# Patient Record
Sex: Female | Born: 1990 | Race: White | Hispanic: Yes | Marital: Married | Smoking: Never smoker
Health system: Southern US, Community
[De-identification: ages and names within clinical notes are randomized; demographics above are authoritative.]

## PROBLEM LIST (undated history)

## (undated) ENCOUNTER — Inpatient Hospital Stay (HOSPITAL_COMMUNITY): Payer: Self-pay

## (undated) DIAGNOSIS — R87629 Unspecified abnormal cytological findings in specimens from vagina: Secondary | ICD-10-CM

## (undated) DIAGNOSIS — Z789 Other specified health status: Secondary | ICD-10-CM

## (undated) HISTORY — PX: NO PAST SURGERIES: SHX2092

## (undated) HISTORY — DX: Unspecified abnormal cytological findings in specimens from vagina: R87.629

---
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
## 2010-07-19 ENCOUNTER — Inpatient Hospital Stay (HOSPITAL_COMMUNITY)
Admission: AD | Admit: 2010-07-19 | Discharge: 2010-07-21 | Payer: Self-pay | Source: Home / Self Care | Attending: Obstetrics | Admitting: Obstetrics

## 2010-09-28 LAB — CBC
HCT: 30.1 % — ABNORMAL LOW (ref 36.0–46.0)
Hemoglobin: 10 g/dL — ABNORMAL LOW (ref 12.0–15.0)
Hemoglobin: 12.4 g/dL (ref 12.0–15.0)
MCH: 28.8 pg (ref 26.0–34.0)
MCH: 29.1 pg (ref 26.0–34.0)
MCHC: 33 g/dL (ref 30.0–36.0)
MCHC: 33.2 g/dL (ref 30.0–36.0)
MCV: 87.4 fL (ref 78.0–100.0)
RBC: 4.3 MIL/uL (ref 3.87–5.11)
RDW: 14.9 % (ref 11.5–15.5)

## 2010-10-22 LAB — CBC
HCT: 30.5 % — ABNORMAL LOW (ref 36.0–46.0)
Hemoglobin: 10.5 g/dL — ABNORMAL LOW (ref 12.0–15.0)
Hemoglobin: 11.5 g/dL — ABNORMAL LOW (ref 12.0–15.0)
MCV: 89.5 fL (ref 78.0–100.0)
RBC: 3.41 MIL/uL — ABNORMAL LOW (ref 3.87–5.11)
RBC: 3.75 MIL/uL — ABNORMAL LOW (ref 3.87–5.11)
WBC: 11.9 10*3/uL — ABNORMAL HIGH (ref 4.0–10.5)
WBC: 7.9 10*3/uL (ref 4.0–10.5)

## 2010-10-22 LAB — WET PREP, GENITAL: Clue Cells Wet Prep HPF POC: NONE SEEN

## 2013-09-10 ENCOUNTER — Encounter: Payer: Self-pay | Admitting: Obstetrics

## 2013-09-10 ENCOUNTER — Ambulatory Visit (INDEPENDENT_AMBULATORY_CARE_PROVIDER_SITE_OTHER): Payer: Self-pay | Admitting: Obstetrics

## 2013-09-10 VITALS — Temp 97.7°F | Ht 62.0 in | Wt 144.0 lb

## 2013-09-10 DIAGNOSIS — Z309 Encounter for contraceptive management, unspecified: Secondary | ICD-10-CM

## 2013-09-10 DIAGNOSIS — Z3046 Encounter for surveillance of implantable subdermal contraceptive: Secondary | ICD-10-CM

## 2013-09-10 MED ORDER — NORGESTIM-ETH ESTRAD TRIPHASIC 0.18/0.215/0.25 MG-25 MCG PO TABS: 1.0000 | ORAL_TABLET | Freq: Every day | ORAL | Status: DC

## 2013-09-10 NOTE — Progress Notes (Signed)
NEXPLANON REMOVAL NOTE  Date of LMP:   09-03-13  Contraception used: *Nexplanon    Indications: The patient desires removal of Nexplanon rod.  She understands risks, benefits, and alternatives to Implanon and would like to proceed.  Anesthesia:   Lidocaine 1% plain.  Procedure:  A time-out was performed confirming the procedure and the patient's allergy status.  The patient's non-dominant was identified as the left arm.  The protection cap was removed. While placing countertraction on the skin, the needle was inserted at a 30 degree angle.  The applicator was held horizontal to the skin; the skin was tented upward as the needle was introduced into the subdermal space.  While holding the applicator in place, the slider was unlocked. The Nexplanon was removed from the field.  The Nexplanon was palpated to ensure proper placement.  Complications: None  Instructions:  The patient was instructed to remove the dressing in 24 hours and that some bruising is to be expected.  She was advised to use over the counter analgesics as needed for any pain at the site.  She is to keep the area dry for 24 hours and to call if her hand or arm becomes cold, numb, or blue.  Return visit:  Return in 2 weeks

## 2013-09-11 ENCOUNTER — Encounter: Payer: Self-pay | Admitting: Obstetrics

## 2014-04-30 ENCOUNTER — Encounter (HOSPITAL_COMMUNITY): Payer: Self-pay

## 2014-04-30 ENCOUNTER — Inpatient Hospital Stay (HOSPITAL_COMMUNITY)
Admission: AD | Admit: 2014-04-30 | Discharge: 2014-04-30 | Disposition: A | Discharge status: Home / Self Care | Payer: Self-pay | Source: Ambulatory Visit | Attending: Obstetrics & Gynecology | Admitting: Obstetrics & Gynecology

## 2014-04-30 ENCOUNTER — Inpatient Hospital Stay (HOSPITAL_COMMUNITY): Payer: Self-pay

## 2014-04-30 DIAGNOSIS — O9989 Other specified diseases and conditions complicating pregnancy, childbirth and the puerperium: Secondary | ICD-10-CM | POA: Insufficient documentation

## 2014-04-30 DIAGNOSIS — O0932 Supervision of pregnancy with insufficient antenatal care, second trimester: Secondary | ICD-10-CM

## 2014-04-30 DIAGNOSIS — Z87891 Personal history of nicotine dependence: Secondary | ICD-10-CM | POA: Insufficient documentation

## 2014-04-30 DIAGNOSIS — R109 Unspecified abdominal pain: Secondary | ICD-10-CM | POA: Insufficient documentation

## 2014-04-30 DIAGNOSIS — O209 Hemorrhage in early pregnancy, unspecified: Secondary | ICD-10-CM

## 2014-04-30 DIAGNOSIS — Z3A21 21 weeks gestation of pregnancy: Secondary | ICD-10-CM

## 2014-04-30 DIAGNOSIS — N939 Abnormal uterine and vaginal bleeding, unspecified: Secondary | ICD-10-CM

## 2014-04-30 HISTORY — DX: Other specified health status: Z78.9

## 2014-04-30 LAB — CBC
HCT: 28.8 % — ABNORMAL LOW (ref 36.0–46.0)
Hemoglobin: 9.8 g/dL — ABNORMAL LOW (ref 12.0–15.0)
MCH: 29.6 pg (ref 26.0–34.0)
MCHC: 34 g/dL (ref 30.0–36.0)
MCV: 87 fL (ref 78.0–100.0)
Platelets: 269 10*3/uL (ref 150–400)
RBC: 3.31 MIL/uL — ABNORMAL LOW (ref 3.87–5.11)
RDW: 13.6 % (ref 11.5–15.5)
WBC: 7.5 10*3/uL (ref 4.0–10.5)

## 2014-04-30 LAB — URINALYSIS, ROUTINE W REFLEX MICROSCOPIC
Bilirubin Urine: NEGATIVE
Glucose, UA: NEGATIVE mg/dL
Hgb urine dipstick: NEGATIVE
Ketones, ur: NEGATIVE mg/dL
Nitrite: NEGATIVE
Protein, ur: NEGATIVE mg/dL
Specific Gravity, Urine: 1.02 (ref 1.005–1.030)
Urobilinogen, UA: 1 mg/dL (ref 0.0–1.0)
pH: 7.5 (ref 5.0–8.0)

## 2014-04-30 LAB — URINE MICROSCOPIC-ADD ON

## 2014-04-30 LAB — DIFFERENTIAL
BASOS PCT: 0 % (ref 0–1)
Basophils Absolute: 0 10*3/uL (ref 0.0–0.1)
Eosinophils Absolute: 0.1 10*3/uL (ref 0.0–0.7)
Eosinophils Relative: 2 % (ref 0–5)
LYMPHS ABS: 2 10*3/uL (ref 0.7–4.0)
Lymphocytes Relative: 27 % (ref 12–46)
MONO ABS: 0.4 10*3/uL (ref 0.1–1.0)
MONOS PCT: 6 % (ref 3–12)
NEUTROS ABS: 4.9 10*3/uL (ref 1.7–7.7)
NEUTROS PCT: 65 % (ref 43–77)

## 2014-04-30 LAB — HIV ANTIBODY (ROUTINE TESTING W REFLEX): HIV: NONREACTIVE

## 2014-04-30 LAB — SYPHILIS: RPR W/REFLEX TO RPR TITER AND TREPONEMAL ANTIBODIES, TRADITIONAL SCREENING AND DIAGNOSIS ALGORITHM

## 2014-04-30 LAB — HEPATITIS B SURFACE ANTIGEN: Hepatitis B Surface Ag: NEGATIVE

## 2014-04-30 LAB — TYPE AND SCREEN
ABO/RH(D): A POS
ANTIBODY SCREEN: NEGATIVE

## 2014-04-30 LAB — ABO/RH: ABO/RH(D): A POS

## 2014-04-30 NOTE — MAU Provider Note (Signed)
History     CSN: [id number]  Arrival date and time: 04/30/14 1045   None     Chief Complaint  Patient presents with  . Abdominal Pain   HPI [name] Reyes is a 23 y.o. [phone] at 2730w5d by LMP who presents for lower abdominal pain x 1 month. Pain is right sided, lower abdominal, describes as sharp and getting a little worse over past month. She has not taken anything for it, it is sometimes made worse with walking up stairs, doesn't know anything that makes it better. She also reports some vaginal spotting x 2 months, slightly blood tinged when she wipes, no large amount of bleeding. Reports +FM that is the same today, denies LOF. Endorses HA (but not currently), no CP, some SOB, no RUQ pain, some leg swelling.   Prenatal course: has not established anywhere, previously seen by Dr. Verdell CarmineHarper's office but no longer accepts adopt a mom, [facility].  OB History   Grav Para Term Preterm Abortions TAB SAB Ect Mult Living   4 3 2 1      2     [id number]. SVDs. 34wk loss due to possible chorioamniotis.  Past Medical History  Diagnosis Date  . Medical history non-contributory     Past Surgical History  Procedure Laterality Date  . No past surgeries      Family History  Problem Relation Age of Onset  . Alcohol abuse Neg Hx     History  Substance Use Topics  . Smoking status: Former Smoker    Types: Cigarettes  . Smokeless tobacco: Never Used  . Alcohol Use: Yes     Comment: Socially    Allergies: No Known Allergies  Prescriptions prior to admission  Medication Sig Dispense Refill  . ibuprofen (ADVIL,MOTRIN) 200 MG tablet Take 400 mg by mouth every 6 (six) hours as needed for headache.         Review of Systems  Constitutional: Negative for fever and chills.  Eyes: Negative for blurred vision and double vision.  Respiratory: Positive for shortness of breath. Negative for cough.   Cardiovascular: Positive for leg swelling. Negative for  chest pain.  Gastrointestinal: Positive for abdominal pain.  Genitourinary: Positive for frequency. Negative for dysuria and urgency.  Neurological: Positive for headaches. Negative for dizziness.  All other systems reviewed and are negative.  Physical Exam   Blood pressure 123/66, pulse 79, temperature 98.7 F (37.1 C), temperature source Oral, resp. rate 16, height 5' 0.5" (1.537 m), weight 61.871 kg (136 lb 6.4 oz), last menstrual period 11/29/2013, SpO2 100.00%.  Physical Exam  Nursing note and vitals reviewed. Constitutional: She is oriented to person, place, and time. She appears well-developed and well-nourished.  HENT:  Head: Normocephalic and atraumatic.  Cardiovascular: Normal rate.  Exam reveals no gallop and no friction rub.   No murmur heard. Respiratory: Effort normal. No respiratory distress. She has no wheezes.  GI: Soft. There is no tenderness. There is no rebound and no guarding.  Genitourinary: Vagina normal.  Cervical os visualized with speculum, no blood noted  Musculoskeletal: She exhibits no edema and no tenderness.  Neurological: She is alert and oriented to person, place, and time.  Skin: Skin is warm and dry.  Psychiatric: She has a normal mood and affect. Her behavior is normal. Judgment and thought content normal.    MAU Course  Procedures  MDM Prenatal labs: CBC, Hep B ag, HIV antibody, RPR, rubella screen, urine GC/chlamydia  OB limited to rule out previa: no previa   Labs: Results for orders placed during the hospital encounter of 04/30/14 (from the past 24 hour(s))  URINALYSIS, ROUTINE W REFLEX MICROSCOPIC   Collection Time    04/30/14 11:05 AM      Result Value Ref Range   Color, Urine YELLOW  YELLOW   APPearance CLOUDY (*) CLEAR   Specific Gravity, Urine 1.020  1.005 - 1.030   pH 7.5  5.0 - 8.0   Glucose, UA NEGATIVE  NEGATIVE mg/dL   Hgb urine dipstick NEGATIVE  NEGATIVE   Bilirubin Urine NEGATIVE  NEGATIVE   Ketones, ur NEGATIVE   NEGATIVE mg/dL   Protein, ur NEGATIVE  NEGATIVE mg/dL   Urobilinogen, UA 1.0  0.0 - 1.0 mg/dL   Nitrite NEGATIVE  NEGATIVE   Leukocytes, UA MODERATE (*) NEGATIVE  URINE MICROSCOPIC-ADD ON   Collection Time    04/30/14 11:05 AM      Result Value Ref Range   Squamous Epithelial / LPF RARE  RARE   WBC, UA 3-6  <3 WBC/hpf   Bacteria, UA FEW (*) RARE  ABO/RH   Collection Time    04/30/14  1:00 PM      Result Value Ref Range   ABO/RH(D) A POS    HEPATITIS B SURFACE ANTIGEN   Collection Time    04/30/14  1:02 PM      Result Value Ref Range   Hepatitis B Surface Ag NEGATIVE  NEGATIVE  RPR   Collection Time    04/30/14  1:02 PM      Result Value Ref Range   RPR NON REAC  NON REAC  CBC   Collection Time    04/30/14  1:02 PM      Result Value Ref Range   WBC 7.5  4.0 - 10.5 K/uL   RBC 3.31 (*) 3.87 - 5.11 MIL/uL   Hemoglobin 9.8 (*) 12.0 - 15.0 g/dL   HCT 16.1 (*) 09.6 - 04.5 %   MCV 87.0  78.0 - 100.0 fL   MCH 29.6  26.0 - 34.0 pg   MCHC 34.0  30.0 - 36.0 g/dL   RDW 40.9  81.1 - 91.4 %   Platelets 269  150 - 400 K/uL  DIFFERENTIAL   Collection Time    04/30/14  1:02 PM      Result Value Ref Range   Neutrophils Relative % 65  43 - 77 %   Neutro Abs 4.9  1.7 - 7.7 K/uL   Lymphocytes Relative 27  12 - 46 %   Lymphs Abs 2.0  0.7 - 4.0 K/uL   Monocytes Relative 6  3 - 12 %   Monocytes Absolute 0.4  0.1 - 1.0 K/uL   Eosinophils Relative 2  0 - 5 %   Eosinophils Absolute 0.1  0.0 - 0.7 K/uL   Basophils Relative 0  0 - 1 %   Basophils Absolute 0.0  0.0 - 0.1 K/uL  HIV ANTIBODY (ROUTINE TESTING)   Collection Time    04/30/14  1:02 PM      Result Value Ref Range   HIV 1&2 Ab, 4th Generation NONREACTIVE  NONREACTIVE  TYPE AND SCREEN   Collection Time    04/30/14  1:02 PM      Result Value Ref Range   ABO/RH(D) A POS     Antibody Screen NEG     Sample Expiration 05/03/2014      Imaging Studies:  No results found.  Assessment and Plan  Patient is 23 y.o. [id number]  9553w5d reporting multiple chronic complaints likely secondary to discomforts of pregnancy - Advised to establish care at health department - Anatomy sono ordered for outpatient - Bleeding precautions  Tawni CarnesWight, Andrew 04/30/2014, 12:18 PM

## 2014-04-30 NOTE — Progress Notes (Signed)
Dr Loreta AveAcosta in earlier to discuss test results and d/c plan. Written and verbal d/c instructions given and understanding voiced

## 2014-04-30 NOTE — Discharge Instructions (Signed)
Hemorragia vaginal durante el embarazo (segundo trimestre) °(Vaginal Bleeding During Pregnancy, Second Trimester) ° Durante el embarazo, es común tener una pequeña hemorragia vaginal (manchas). A veces, la hemorragia es normal y no representa un problema, pero en algunas ocasiones es un síntoma de algo grave. Asegúrese de decirle a su médico de inmediato si tiene algún tipo de hemorragia vaginal. °CUIDADOS EN EL HOGAR °· Controle su afección para ver si hay cambios. °· Siga las indicaciones de su médico con respecto al grado de actividad que puede tener. °· Si debe hacer reposo en cama: °¨ Es posible que deba quedarse en cama y levantarse únicamente para ir al baño. °¨ Quizás le permitan hacer algunas actividades. °¨ Si es necesario, planifique que alguien la ayude. °· Escriba: °¨ La cantidad de toallas higiénicas que usa cada día. °¨ La frecuencia con la que se cambia las toallas higiénicas. °¨ Indique que tan empapados (saturados) están. °· No use tampones. °· No se haga duchas vaginales. °· No tenga relaciones sexuales ni orgasmos hasta que el médico la autorice. °· Si elimina tejido por la vagina, guárdelo para mostrárselo al médico. °· Tome los medicamentos solamente como se lo haya indicado el médico. °· No tome aspirina, ya que puede causar hemorragias. °· No haga ejercicios, no levante objetos pesados ni haga ninguna actividad que exija mucha energía y esfuerzo, salvo que su médico la autorice. °· Concurra a todas las visitas de control como se lo haya indicado el médico. °SOLICITE AYUDA SI:  °· Tiene una hemorragia vaginal. °· Tiene cólicos. °· Tiene dolores de parto. °· Tiene fiebre que no desaparece después de tomar medicamentos. °SOLICITE AYUDA DE INMEDIATO SI: °· Siente cólicos muy intensos en la espalda o en el vientre (abdomen). °· Siente contracciones. °· Tiene escalofríos. °· Elimina coágulos grandes o tejido por la vagina. °· Tiene más hemorragia. °· Se siente débil o que va a  desvanecerse. °· Pierde el conocimiento (se desmaya). °· Tiene una pérdida importante o sale líquido a borbotones por la vagina. °ASEGÚRESE DE QUE: °· Comprende estas instrucciones. °· Controlará su afección. °· Recibirá ayuda de inmediato si no mejora o si empeora. °Document Released: 11/19/2013 °Exit[employer]® Patient Information ©2015 [employer]. This information is not intended to replace advice given to you by your health care provider. Make sure you discuss any questions you have with your health care provider. ° °

## 2014-04-30 NOTE — Progress Notes (Signed)
Dr Delford FieldWright on unit and aware of pt's admission and status. Will see pt

## 2014-04-30 NOTE — MAU Note (Signed)
Patient states she has had no prenatal care. Has had abdominal pain for about one month but was worse last night. Had spotting 3 days ago but none now. Reports feeling fetal movement.

## 2014-05-01 LAB — GC/CHLAMYDIA PROBE AMP
CT Probe RNA: POSITIVE — AB
GC Probe RNA: NEGATIVE

## 2014-05-01 LAB — RUBELLA SCREEN: RUBELLA: 0.4 {index} (ref <0.90)

## 2014-05-02 ENCOUNTER — Encounter (HOSPITAL_COMMUNITY): Payer: Self-pay | Admitting: *Deleted

## 2014-05-20 ENCOUNTER — Encounter (HOSPITAL_COMMUNITY): Payer: Self-pay

## 2014-06-03 ENCOUNTER — Other Ambulatory Visit (HOSPITAL_COMMUNITY): Payer: Self-pay | Admitting: Physician Assistant

## 2014-06-03 DIAGNOSIS — Z3689 Encounter for other specified antenatal screening: Secondary | ICD-10-CM

## 2014-06-03 LAB — OB RESULTS CONSOLE GC/CHLAMYDIA
Chlamydia: NEGATIVE
GC PROBE AMP, GENITAL: NEGATIVE

## 2014-06-03 LAB — OB RESULTS CONSOLE VARICELLA ZOSTER ANTIBODY, IGG: Varicella: IMMUNE

## 2014-06-03 LAB — OB RESULTS CONSOLE RPR: RPR: NONREACTIVE

## 2014-06-03 LAB — OB RESULTS CONSOLE ABO/RH: RH TYPE: POSITIVE

## 2014-06-03 LAB — OB RESULTS CONSOLE HIV ANTIBODY (ROUTINE TESTING): HIV: NONREACTIVE

## 2014-06-03 LAB — OB RESULTS CONSOLE HEPATITIS B SURFACE ANTIGEN: Hepatitis B Surface Ag: NEGATIVE

## 2014-06-03 LAB — DRUG SCREEN, URINE: Drug Screen, Urine: NEGATIVE

## 2014-06-03 LAB — CULTURE, OB URINE: Urine Culture, OB: NEGATIVE

## 2014-06-03 LAB — OB RESULTS CONSOLE PLATELET COUNT: PLATELETS: 267 10*3/uL

## 2014-06-03 LAB — GLUCOSE TOLERANCE, 1 HOUR (50G) W/O FASTING: GLUCOSE 1 HOUR GTT: 80

## 2014-06-03 LAB — OB RESULTS CONSOLE RUBELLA ANTIBODY, IGM: RUBELLA: NON-IMMUNE/NOT IMMUNE

## 2014-06-03 LAB — CYSTIC FIBROSIS DIAGNOSTIC STUDY: INTERPRETATION-CFDNA: NEGATIVE

## 2014-06-03 LAB — OB RESULTS CONSOLE HGB/HCT, BLOOD
HCT: 31 %
Hemoglobin: 10 g/dL

## 2014-06-03 LAB — CYTOLOGY - PAP: CYTOLOGY - PAP: NEGATIVE

## 2014-06-05 ENCOUNTER — Other Ambulatory Visit (HOSPITAL_COMMUNITY): Payer: Self-pay | Admitting: Physician Assistant

## 2014-06-05 ENCOUNTER — Ambulatory Visit (HOSPITAL_COMMUNITY)
Admission: RE | Admit: 2014-06-05 | Discharge: 2014-06-05 | Disposition: A | Discharge status: Home / Self Care | Payer: Self-pay | Source: Ambulatory Visit | Attending: Physician Assistant | Admitting: Physician Assistant

## 2014-06-05 DIAGNOSIS — Z3A26 26 weeks gestation of pregnancy: Secondary | ICD-10-CM | POA: Insufficient documentation

## 2014-06-05 DIAGNOSIS — Z3689 Encounter for other specified antenatal screening: Secondary | ICD-10-CM | POA: Insufficient documentation

## 2014-06-05 DIAGNOSIS — O283 Abnormal ultrasonic finding on antenatal screening of mother: Secondary | ICD-10-CM

## 2014-06-05 DIAGNOSIS — Z36 Encounter for antenatal screening of mother: Secondary | ICD-10-CM | POA: Insufficient documentation

## 2014-06-20 ENCOUNTER — Encounter: Payer: Self-pay | Admitting: Obstetrics & Gynecology

## 2014-06-20 ENCOUNTER — Ambulatory Visit (INDEPENDENT_AMBULATORY_CARE_PROVIDER_SITE_OTHER): Payer: Self-pay | Admitting: Obstetrics & Gynecology

## 2014-06-20 ENCOUNTER — Ambulatory Visit (HOSPITAL_COMMUNITY)
Admission: RE | Admit: 2014-06-20 | Discharge: 2014-06-20 | Disposition: A | Discharge status: Home / Self Care | Payer: Self-pay | Source: Ambulatory Visit | Attending: Obstetrics & Gynecology | Admitting: Obstetrics & Gynecology

## 2014-06-20 VITALS — BP 115/65 | HR 95 | Temp 97.5°F | Wt 140.4 lb

## 2014-06-20 DIAGNOSIS — Z23 Encounter for immunization: Secondary | ICD-10-CM

## 2014-06-20 DIAGNOSIS — Z3A29 29 weeks gestation of pregnancy: Secondary | ICD-10-CM | POA: Insufficient documentation

## 2014-06-20 DIAGNOSIS — O09299 Supervision of pregnancy with other poor reproductive or obstetric history, unspecified trimester: Secondary | ICD-10-CM | POA: Insufficient documentation

## 2014-06-20 DIAGNOSIS — O98319 Other infections with a predominantly sexual mode of transmission complicating pregnancy, unspecified trimester: Secondary | ICD-10-CM

## 2014-06-20 DIAGNOSIS — O0930 Supervision of pregnancy with insufficient antenatal care, unspecified trimester: Secondary | ICD-10-CM | POA: Insufficient documentation

## 2014-06-20 DIAGNOSIS — O09293 Supervision of pregnancy with other poor reproductive or obstetric history, third trimester: Secondary | ICD-10-CM | POA: Insufficient documentation

## 2014-06-20 DIAGNOSIS — O09213 Supervision of pregnancy with history of pre-term labor, third trimester: Secondary | ICD-10-CM | POA: Insufficient documentation

## 2014-06-20 DIAGNOSIS — A749 Chlamydial infection, unspecified: Secondary | ICD-10-CM | POA: Insufficient documentation

## 2014-06-20 DIAGNOSIS — Z2839 Other underimmunization status: Secondary | ICD-10-CM | POA: Insufficient documentation

## 2014-06-20 DIAGNOSIS — O9989 Other specified diseases and conditions complicating pregnancy, childbirth and the puerperium: Secondary | ICD-10-CM

## 2014-06-20 DIAGNOSIS — O0933 Supervision of pregnancy with insufficient antenatal care, third trimester: Secondary | ICD-10-CM

## 2014-06-20 DIAGNOSIS — O98819 Other maternal infectious and parasitic diseases complicating pregnancy, unspecified trimester: Secondary | ICD-10-CM

## 2014-06-20 DIAGNOSIS — Z36 Encounter for antenatal screening of mother: Secondary | ICD-10-CM | POA: Insufficient documentation

## 2014-06-20 DIAGNOSIS — Z283 Underimmunization status: Secondary | ICD-10-CM

## 2014-06-20 LAB — POCT URINALYSIS DIP (DEVICE)
Bilirubin Urine: NEGATIVE
Glucose, UA: NEGATIVE mg/dL
Hgb urine dipstick: NEGATIVE
KETONES UR: NEGATIVE mg/dL
Nitrite: NEGATIVE
PROTEIN: NEGATIVE mg/dL
SPECIFIC GRAVITY, URINE: 1.025 (ref 1.005–1.030)
Urobilinogen, UA: 1 mg/dL (ref 0.0–1.0)
pH: 6.5 (ref 5.0–8.0)

## 2014-06-20 MED ORDER — TETANUS-DIPHTH-ACELL PERTUSSIS 5-2.5-18.5 LF-MCG/0.5 IM SUSP
0.5000 mL | Freq: Once | INTRAMUSCULAR | Status: AC
  Administered 2014-06-20: 0.5 mL via INTRAMUSCULAR

## 2014-06-20 NOTE — Patient Instructions (Signed)
Return to clinic for any obstetric concerns or go to MAU for evaluation  

## 2014-06-20 NOTE — Progress Notes (Signed)
Transfer from health department for history of IUFD at 34 weeks-- up to date on all lab work.  Guidelines for Antenatal Testing, Sonography and Delivery  Previous Stillbirth (> 28 wks) - V23.5 20-24-28-32-36 28//BPP wkly then 32//2 x wk 39  Reports occasional pelvic pressure and edema in feet, reassured.  New OB packet given by RN. Tdap today. Problem list updated Patient had limited anatomy scan and prominent bowel, rescan scheduled for 06/26/14 No other complaints or concerns.  Labor and fetal movement precautions reviewed.

## 2014-06-20 NOTE — Progress Notes (Signed)
Nutrition note: 1st visit consult Pt has lost 1.6# @ 29w. Pt reports eating 1-2 meals & 1-2 snacks/d. Pt is taking a PNV. Pt reports no N/V but has some heartburn & takes tums, which helps. NKFA. Pt received verbal & written education on general nutrition during pregnancy. Provided handout with energy dense snacks. Discussed tips to decrease heartburn. Discussed wt gain goals of 15-25# or 0.6#/wk. Pt agrees to try to eat energy dense snacks & consume protein at all meals & snacks. Pt has WIC & plans to BF. F/u in 4-6 wks Blondell RevealLaura Shantoya Geurts, MS, RD, LDN, [facility]

## 2014-06-20 NOTE — Progress Notes (Signed)
BPP today, pt escorted to Radiology.  Follow-up with BPP 06/26/14 with MFC @ 9a.  BPP with Radiology 07/04/14 @ 945a and 07/11/14 @ 945a.

## 2014-06-26 ENCOUNTER — Ambulatory Visit (HOSPITAL_COMMUNITY): Payer: Self-pay | Attending: Physician Assistant

## 2014-07-04 ENCOUNTER — Encounter: Payer: Self-pay | Admitting: Family Medicine

## 2014-07-04 ENCOUNTER — Encounter: Payer: Self-pay | Admitting: Obstetrics & Gynecology

## 2014-07-04 ENCOUNTER — Ambulatory Visit (HOSPITAL_COMMUNITY): Admission: RE | Admit: 2014-07-04 | Payer: Self-pay | Source: Ambulatory Visit

## 2014-07-11 ENCOUNTER — Ambulatory Visit (HOSPITAL_COMMUNITY): Payer: Self-pay | Attending: Obstetrics & Gynecology

## 2014-07-19 NOTE — L&D Delivery Note (Cosign Needed Addendum)
Called to room by L&D Nursing staff for precipitous vaginal delivery  Delivery Note  First Stage: Labor onset: 1752 Augmentation : Pitocin Analgesia /Anesthesia intrapartum: epidural AROM at 1752  Second Stage: Complete dilation at 1855 Onset of pushing at 1855 FHR second stage 125 bpm  Delivery of a viable female at [id number] by CNM in LOP position NO nuchal cord / loose true knot in cord Cord double clamped immediately and cut by FOB Cord blood sample collected   **Delivery care assumed by Cathie BeamsFran Cresenzo-Dishmon, CNM for third stage of labor**  Complications: true knot in cord   Newborn: Birth Weight: pending  Apgar Scores: 8/9  Raelyn MoraDAWSON, ROLITTA, M  MSN, CNM 08/29/2014, 7:45 PM   40 units of pitocin diluted in 1000cc LR was infused rapidly IV.  The placenta separated spontaneously and delivered via CCT and maternal pushing effort.  It was inspected and appears to be intact with a 3 VC. No lacerations.  EBL 200cc CRESENZO-DISHMAN,Madox Corkins

## 2014-08-26 ENCOUNTER — Encounter: Payer: Self-pay | Admitting: Obstetrics and Gynecology

## 2014-08-26 ENCOUNTER — Other Ambulatory Visit: Payer: Self-pay | Admitting: Obstetrics and Gynecology

## 2014-08-26 ENCOUNTER — Ambulatory Visit (HOSPITAL_COMMUNITY): Admission: RE | Admit: 2014-08-26 | Payer: Self-pay | Source: Ambulatory Visit

## 2014-08-26 ENCOUNTER — Ambulatory Visit (INDEPENDENT_AMBULATORY_CARE_PROVIDER_SITE_OTHER): Payer: Self-pay | Admitting: Obstetrics and Gynecology

## 2014-08-26 VITALS — BP 110/64 | HR 67 | Temp 97.7°F | Wt 151.0 lb

## 2014-08-26 DIAGNOSIS — O9989 Other specified diseases and conditions complicating pregnancy, childbirth and the puerperium: Secondary | ICD-10-CM

## 2014-08-26 DIAGNOSIS — Z113 Encounter for screening for infections with a predominantly sexual mode of transmission: Secondary | ICD-10-CM

## 2014-08-26 DIAGNOSIS — O0933 Supervision of pregnancy with insufficient antenatal care, third trimester: Secondary | ICD-10-CM

## 2014-08-26 DIAGNOSIS — O09293 Supervision of pregnancy with other poor reproductive or obstetric history, third trimester: Secondary | ICD-10-CM

## 2014-08-26 DIAGNOSIS — Z118 Encounter for screening for other infectious and parasitic diseases: Secondary | ICD-10-CM

## 2014-08-26 DIAGNOSIS — O09899 Supervision of other high risk pregnancies, unspecified trimester: Secondary | ICD-10-CM

## 2014-08-26 DIAGNOSIS — Z283 Underimmunization status: Secondary | ICD-10-CM

## 2014-08-26 DIAGNOSIS — A749 Chlamydial infection, unspecified: Secondary | ICD-10-CM

## 2014-08-26 DIAGNOSIS — O98819 Other maternal infectious and parasitic diseases complicating pregnancy, unspecified trimester: Secondary | ICD-10-CM

## 2014-08-26 DIAGNOSIS — O98319 Other infections with a predominantly sexual mode of transmission complicating pregnancy, unspecified trimester: Secondary | ICD-10-CM

## 2014-08-26 LAB — POCT URINALYSIS DIP (DEVICE)
Bilirubin Urine: NEGATIVE
Glucose, UA: NEGATIVE mg/dL
HGB URINE DIPSTICK: NEGATIVE
Ketones, ur: NEGATIVE mg/dL
Leukocytes, UA: NEGATIVE
Nitrite: NEGATIVE
PH: 7 (ref 5.0–8.0)
PROTEIN: NEGATIVE mg/dL
Specific Gravity, Urine: 1.015 (ref 1.005–1.030)
Urobilinogen, UA: 0.2 mg/dL (ref 0.0–1.0)

## 2014-08-26 LAB — OB RESULTS CONSOLE GBS: GBS: POSITIVE

## 2014-08-26 NOTE — Progress Notes (Signed)
IOL 08/29/14 @ 730a.  Pt instructed to wait in lobby and will be escorted in 15-20mins to U/S department.  Pt agreeable.  During timeframe pt observed sitting in lobby while working with other patients.  Pt not in lobby when called.  Attempted to reach pt by cell phone, no answer, no option to leave vm.  Will notify Dr. Lauree Chandleroncstant.

## 2014-08-26 NOTE — Addendum Note (Signed)
Addended by: Jill SideAY, Makailah Slavick L on: 08/26/2014 10:35 AM   Modules accepted: Orders

## 2014-08-26 NOTE — Progress Notes (Signed)
Patient is doing well without complaints. She states that she was out of town and missed all of her appointments. Cultures collected today. BPP today IOL on 2/11

## 2014-08-26 NOTE — Progress Notes (Deleted)
IOL 08/29/14 @ 730a.  Pt instructed to wait in lobby and will be escorted in 15-20mins to U/S department.  Pt agreeable.  Pt not in lobby when called.  Attempted to contact pt on cell phone, no answer, no option to leave vm.  Will notify Dr. Jolayne Pantheronstant.

## 2014-08-26 NOTE — Progress Notes (Signed)
Reports lower back pain and pelvic pressure

## 2014-08-27 ENCOUNTER — Telehealth (HOSPITAL_COMMUNITY): Payer: Self-pay | Admitting: *Deleted

## 2014-08-27 ENCOUNTER — Encounter (HOSPITAL_COMMUNITY): Payer: Self-pay | Admitting: *Deleted

## 2014-08-27 LAB — CULTURE, BETA STREP (GROUP B ONLY)

## 2014-08-27 LAB — GC/CHLAMYDIA PROBE AMP
CT Probe RNA: NEGATIVE
GC PROBE AMP APTIMA: NEGATIVE

## 2014-08-27 NOTE — Telephone Encounter (Signed)
Preadmission screen Interpreter number [phone]

## 2014-08-29 ENCOUNTER — Inpatient Hospital Stay (HOSPITAL_COMMUNITY)
Admission: AD | Admit: 2014-08-29 | Discharge: 2014-08-31 | DRG: 775 | Disposition: A | Discharge status: Home / Self Care | Payer: Medicaid Other | Source: Ambulatory Visit | Attending: Obstetrics & Gynecology | Admitting: Obstetrics & Gynecology

## 2014-08-29 ENCOUNTER — Inpatient Hospital Stay (HOSPITAL_COMMUNITY): Admission: RE | Admit: 2014-08-29 | Payer: Self-pay | Source: Ambulatory Visit

## 2014-08-29 ENCOUNTER — Encounter (HOSPITAL_COMMUNITY): Payer: Self-pay | Admitting: *Deleted

## 2014-08-29 DIAGNOSIS — O99824 Streptococcus B carrier state complicating childbirth: Secondary | ICD-10-CM | POA: Diagnosis present

## 2014-08-29 DIAGNOSIS — O09293 Supervision of pregnancy with other poor reproductive or obstetric history, third trimester: Secondary | ICD-10-CM

## 2014-08-29 DIAGNOSIS — O09299 Supervision of pregnancy with other poor reproductive or obstetric history, unspecified trimester: Secondary | ICD-10-CM

## 2014-08-29 DIAGNOSIS — Z87891 Personal history of nicotine dependence: Secondary | ICD-10-CM

## 2014-08-29 DIAGNOSIS — O98819 Other maternal infectious and parasitic diseases complicating pregnancy, unspecified trimester: Secondary | ICD-10-CM

## 2014-08-29 DIAGNOSIS — O0933 Supervision of pregnancy with insufficient antenatal care, third trimester: Secondary | ICD-10-CM | POA: Diagnosis not present

## 2014-08-29 DIAGNOSIS — Z3A34 34 weeks gestation of pregnancy: Secondary | ICD-10-CM | POA: Diagnosis present

## 2014-08-29 DIAGNOSIS — A749 Chlamydial infection, unspecified: Secondary | ICD-10-CM

## 2014-08-29 DIAGNOSIS — O09899 Supervision of other high risk pregnancies, unspecified trimester: Secondary | ICD-10-CM

## 2014-08-29 DIAGNOSIS — O9989 Other specified diseases and conditions complicating pregnancy, childbirth and the puerperium: Secondary | ICD-10-CM | POA: Diagnosis present

## 2014-08-29 DIAGNOSIS — Z283 Underimmunization status: Secondary | ICD-10-CM

## 2014-08-29 LAB — TYPE AND SCREEN
ABO/RH(D): A POS
Antibody Screen: NEGATIVE

## 2014-08-29 LAB — CBC
HCT: 33.1 % — ABNORMAL LOW (ref 36.0–46.0)
HEMOGLOBIN: 10.9 g/dL — AB (ref 12.0–15.0)
MCH: 27.3 pg (ref 26.0–34.0)
MCHC: 32.9 g/dL (ref 30.0–36.0)
MCV: 82.8 fL (ref 78.0–100.0)
Platelets: 198 10*3/uL (ref 150–400)
RBC: 4 MIL/uL (ref 3.87–5.11)
RDW: 15.2 % (ref 11.5–15.5)
WBC: 7.7 10*3/uL (ref 4.0–10.5)

## 2014-08-29 MED ORDER — FLEET ENEMA 7-19 GM/118ML RE ENEM: 1.0000 | ENEMA | RECTAL | Status: DC | PRN

## 2014-08-29 MED ORDER — WITCH HAZEL-GLYCERIN EX PADS: 1.0000 "application " | MEDICATED_PAD | CUTANEOUS | Status: DC | PRN

## 2014-08-29 MED ORDER — PRENATAL MULTIVITAMIN CH
1.0000 | ORAL_TABLET | Freq: Every day | ORAL | Status: DC
  Administered 2014-08-30 – 2014-08-31 (×2): 1 via ORAL
  Filled 2014-08-29 (×2): qty 1

## 2014-08-29 MED ORDER — IBUPROFEN 600 MG PO TABS
600.0000 mg | ORAL_TABLET | Freq: Four times a day (QID) | ORAL | Status: DC
  Administered 2014-08-29 – 2014-08-31 (×7): 600 mg via ORAL
  Filled 2014-08-29 (×7): qty 1

## 2014-08-29 MED ORDER — ACETAMINOPHEN 325 MG PO TABS: 650.0000 mg | ORAL_TABLET | ORAL | Status: DC | PRN

## 2014-08-29 MED ORDER — PENICILLIN G POTASSIUM 5000000 UNITS IJ SOLR
2.5000 10*6.[IU] | INTRAVENOUS | Status: DC
  Administered 2014-08-29 (×2): 2.5 10*6.[IU] via INTRAVENOUS
  Filled 2014-08-29 (×4): qty 2.5

## 2014-08-29 MED ORDER — PENICILLIN G POTASSIUM 5000000 UNITS IJ SOLR
5.0000 10*6.[IU] | Freq: Once | INTRAMUSCULAR | Status: AC
  Administered 2014-08-29: 5 10*6.[IU] via INTRAVENOUS
  Filled 2014-08-29: qty 5

## 2014-08-29 MED ORDER — INFLUENZA VAC SPLIT QUAD 0.5 ML IM SUSY
0.5000 mL | PREFILLED_SYRINGE | INTRAMUSCULAR | Status: AC
  Administered 2014-08-30: 0.5 mL via INTRAMUSCULAR
  Filled 2014-08-29: qty 0.5

## 2014-08-29 MED ORDER — ONDANSETRON HCL 4 MG PO TABS: 4.0000 mg | ORAL_TABLET | ORAL | Status: DC | PRN

## 2014-08-29 MED ORDER — LANOLIN HYDROUS EX OINT: TOPICAL_OINTMENT | CUTANEOUS | Status: DC | PRN

## 2014-08-29 MED ORDER — PRENATAL PLUS 27-1 MG PO TABS: 1.0000 | ORAL_TABLET | Freq: Every day | ORAL | Status: DC

## 2014-08-29 MED ORDER — OXYCODONE-ACETAMINOPHEN 5-325 MG PO TABS: 2.0000 | ORAL_TABLET | ORAL | Status: DC | PRN

## 2014-08-29 MED ORDER — LIDOCAINE HCL (PF) 1 % IJ SOLN
30.0000 mL | INTRAMUSCULAR | Status: DC | PRN
  Filled 2014-08-29: qty 30

## 2014-08-29 MED ORDER — OXYTOCIN 40 UNITS IN LACTATED RINGERS INFUSION - SIMPLE MED: 62.5000 mL/h | INTRAVENOUS | Status: DC

## 2014-08-29 MED ORDER — BENZOCAINE-MENTHOL 20-0.5 % EX AERO
1.0000 "application " | INHALATION_SPRAY | CUTANEOUS | Status: DC | PRN
  Administered 2014-08-30: 1 via TOPICAL
  Filled 2014-08-29: qty 56

## 2014-08-29 MED ORDER — OXYCODONE-ACETAMINOPHEN 5-325 MG PO TABS
1.0000 | ORAL_TABLET | ORAL | Status: DC | PRN
  Administered 2014-08-30: 1 via ORAL
  Filled 2014-08-29 (×2): qty 1

## 2014-08-29 MED ORDER — OXYTOCIN 40 UNITS IN LACTATED RINGERS INFUSION - SIMPLE MED: 62.5000 mL/h | INTRAVENOUS | Status: DC | PRN

## 2014-08-29 MED ORDER — OXYTOCIN 40 UNITS IN LACTATED RINGERS INFUSION - SIMPLE MED
1.0000 m[IU]/min | INTRAVENOUS | Status: DC
Start: 2014-08-29 — End: 2014-08-29
  Administered 2014-08-29: 2 m[IU]/min via INTRAVENOUS
  Administered 2014-08-29: 4 m[IU]/min via INTRAVENOUS

## 2014-08-29 MED ORDER — TERBUTALINE SULFATE 1 MG/ML IJ SOLN
0.2500 mg | Freq: Once | INTRAMUSCULAR | Status: DC | PRN
  Filled 2014-08-29: qty 1

## 2014-08-29 MED ORDER — CITRIC ACID-SODIUM CITRATE 334-500 MG/5ML PO SOLN: 30.0000 mL | ORAL | Status: DC | PRN

## 2014-08-29 MED ORDER — FENTANYL CITRATE 0.05 MG/ML IJ SOLN
100.0000 ug | INTRAMUSCULAR | Status: DC | PRN
  Administered 2014-08-29: 100 ug via INTRAVENOUS
  Filled 2014-08-29: qty 2

## 2014-08-29 MED ORDER — OXYTOCIN BOLUS FROM INFUSION: 500.0000 mL | INTRAVENOUS | Status: DC

## 2014-08-29 MED ORDER — TETANUS-DIPHTH-ACELL PERTUSSIS 5-2.5-18.5 LF-MCG/0.5 IM SUSP: 0.5000 mL | Freq: Once | INTRAMUSCULAR | Status: DC

## 2014-08-29 MED ORDER — SENNOSIDES-DOCUSATE SODIUM 8.6-50 MG PO TABS
2.0000 | ORAL_TABLET | ORAL | Status: DC
  Administered 2014-08-30 (×2): 2 via ORAL
  Filled 2014-08-29 (×2): qty 2

## 2014-08-29 MED ORDER — LACTATED RINGERS IV SOLN: INTRAVENOUS | Status: DC

## 2014-08-29 MED ORDER — ONDANSETRON HCL 4 MG/2ML IJ SOLN: 4.0000 mg | INTRAMUSCULAR | Status: DC | PRN

## 2014-08-29 MED ORDER — OXYCODONE-ACETAMINOPHEN 5-325 MG PO TABS
1.0000 | ORAL_TABLET | ORAL | Status: DC | PRN
  Administered 2014-08-30: 1 via ORAL

## 2014-08-29 MED ORDER — DIBUCAINE 1 % RE OINT: 1.0000 "application " | TOPICAL_OINTMENT | RECTAL | Status: DC | PRN

## 2014-08-29 MED ORDER — SIMETHICONE 80 MG PO CHEW: 80.0000 mg | CHEWABLE_TABLET | ORAL | Status: DC | PRN

## 2014-08-29 MED ORDER — LACTATED RINGERS IV SOLN: 500.0000 mL | INTRAVENOUS | Status: DC | PRN

## 2014-08-29 MED ORDER — DIPHENHYDRAMINE HCL 25 MG PO CAPS
25.0000 mg | ORAL_CAPSULE | Freq: Four times a day (QID) | ORAL | Status: DC | PRN
Start: 2014-08-29 — End: 2014-08-31

## 2014-08-29 MED ORDER — TERBUTALINE SULFATE 1 MG/ML IJ SOLN
0.2500 mg | Freq: Once | INTRAMUSCULAR | Status: AC | PRN
  Filled 2014-08-29: qty 1

## 2014-08-29 MED ORDER — ONDANSETRON HCL 4 MG/2ML IJ SOLN: 4.0000 mg | Freq: Four times a day (QID) | INTRAMUSCULAR | Status: DC | PRN

## 2014-08-29 NOTE — Progress Notes (Signed)
Arita Missawson, CNM and Standard, CNM in hallway requested to come stand by

## 2014-08-29 NOTE — H&P (Signed)
[name] Reyes is a 24 y.o. female presenting for IOL 2/2 to hx of IUFD at 34 weeks in previous pregnancy. She reports mild, irregular contractions that started 3 days ago. No rupture of membranes or vaginal bleeding. Good fetal movement. She reports no complications of this pregnancy with all normal anatomy scans and declined genetic screen. Has been having back pain for past few weeks. Headache started last night. No changes in vision. History of chlamydia infection at 26 weeks that was treated. All previous pregnancies were delivered vaginally with no complications. Wants to attempt a natural delivery. Agreeable to epidural or IV pain medicines if she changes her mind.   Maternal Medical History:  Fetal activity: Perceived fetal activity is normal.   Last perceived fetal movement was within the past hour.    Prenatal complications: Infection (Chlamydia).   No PIH or pre-eclampsia.     OB History    Gravida Para Term Preterm AB TAB SAB Ectopic Multiple Living   Past Medical History  Diagnosis Date  . Medical history non-contributory    Past Surgical History  Procedure Laterality Date  . No past surgeries     Family History: family history includes Vision loss in her paternal grandmother. There is no history of Alcohol abuse, Arthritis, Asthma, Birth defects, Cancer, COPD, Depression, Diabetes, Drug abuse, Early death, Hearing loss, Heart disease, Hyperlipidemia, Hypertension, Kidney disease, Learning disabilities, Mental illness, Mental retardation, Miscarriages / Stillbirths, Stroke, or Varicose Veins. Social History:  reports that she has quit smoking. Her smoking use included Cigarettes. She has never used smokeless tobacco. She reports that she does not drink alcohol or use illicit drugs.   Prenatal Transfer Tool  Maternal Diabetes: No Genetic Screening: Declined Maternal Ultrasounds/Referrals: Abnormal:  Findings:   Other:Prominent fetal bowel Fetal  Ultrasounds or other Referrals:  None Maternal Substance Abuse:  No Significant Maternal Medications:  None Significant Maternal Lab Results:  Lab values include: Group B Strep positive Other Comments:  None  Review of Systems  Constitutional: Negative for fever and chills.  Eyes: Negative for blurred vision and double vision.  Respiratory: Negative for cough and shortness of breath.   Cardiovascular: Negative for chest pain.  Gastrointestinal: Positive for heartburn. Negative for vomiting, abdominal pain and diarrhea.  Genitourinary: Negative for dysuria.  Musculoskeletal: Positive for back pain.  Neurological: Positive for headaches. Negative for dizziness.      Blood pressure 119/80, pulse 72, last menstrual period 11/29/2013. Maternal Exam:  Abdomen: Patient reports no abdominal tenderness.   Physical Exam  Constitutional: She is oriented to person, place, and time. She appears well-developed and well-nourished.  Cardiovascular: Normal rate and regular rhythm.   Respiratory: Breath sounds normal.  GI: Soft.  Neurological: She is alert and oriented to person, place, and time.  Skin: Skin is warm and dry.    Prenatal labs: ABO, Rh: A/Positive/-- (11/16 0000) Antibody: NEG (10/13 1302) Rubella: Nonimmune (11/16 0000) RPR: Nonreactive (11/16 0000)  HBsAg: Negative (11/16 0000)  HIV: Non-reactive (11/16 0000)  GBS: Positive (02/08 0000)   Assessment/Plan: 1. IOL 2/2 previous IUFD at 34 weeks - Pitocin  2. GBS positive - PCN   Barrett,Stevi M 08/29/2014, 9:14 AM    OB fellow attestation:  I have seen and examined this patient; I agree with above documentation in the resident's note.   [name] Reyes is a 24 y.o. [phone] here for IOL 2/2 hx of previous  IUFD at 34w at which time she also had chorioamnionitis.  Late to care, chlamydia with negative TOC.  PE: BP 119/80 mmHg  Pulse 72  Temp(Src) 98.1 F (36.7 C) (Oral)  Resp 18  LMP 11/29/2013 Gen: calm  comfortable, NAD Resp: normal effort, no distress Abd: gravid     ROS, labs, PMH reviewed  Clinic [facility] at 27 weeks -> [facility] at 29 weeks due to h/o IUFD Prenatal Labs  Dating LMP consistent with 21 week scan Blood type: --/--/A POS (10/13 1302)  Genetic Screen Too late Antibody:NEG (10/13 1302)  Anatomic US Normal except for prominent fetal bowel and limited heart views [ ]  rescan on 06/26/14 Rubella: 0.40 (10/13 1302)  GTT Third trimester: 80 RPR: NON REAC (10/13 1302)   TDaP vaccine 06/20/14 HBsAg: NEGATIVE (10/13 1302)   Flu vaccine 06/05/14 HIV: NONREACTIVE (10/13 1302)   GBS  GBS: positive  Contraception  ZOX:WRUEAVWUPap:Negative 06/03/14  Baby Food    Circumcision    Pediatrician    Support Person     Plan: MOF:  bottle MOC: nexplanon ID: GBS pos => PCN FWB: cat I Labor: pitocin for induction Pain: epidural upon request if so desires  [name] Reyes 08/29/2014, 10:09 AM

## 2014-08-29 NOTE — Progress Notes (Signed)
LABOR PROGRESS NOTE  [name] Reyes is a 24 y.o. [phone] at [redacted]w[redacted]d  admitted for for IOL 2/2 IUFD at 34w  Subjective: Starting to feel some discomfort  Objective: BP 126/80 mmHg  Pulse 67  Temp(Src) 98.3 F (36.8 C) (Oral)  Resp 18  Ht 5' 0.5" (1.537 m)  Wt 151 lb (68.493 kg)  BMI 28.99 kg/m2  LMP 11/29/2013 or  Filed Vitals:   08/29/14 1554 08/29/14 1601 08/29/14 1704 08/29/14 1732  BP:  113/79 108/68 126/80  Pulse:  69 72 67  Temp:      TempSrc:      Resp:  18    Height: 5' 0.5" (1.537 m)     Weight: 151 lb (68.493 kg)       AROM clear  Dilation: 4.5 Effacement (%): 80 Cervical Position: Middle Station: -2 Presentation: Vertex Exam by:: Dr. Deniece Ree  Labs: Lab Results  Component Value Date   WBC 7.7 08/29/2014   HGB 10.9* 08/29/2014   HCT 33.1* 08/29/2014   MCV 82.8 08/29/2014   PLT 198 08/29/2014    Patient Active Problem List   Diagnosis Date Noted  . Prior pregnancy with fetal demise 08/29/2014  . Prior pregnancy with fetal demise at 76 weeks, antepartum 06/20/2014  . Prior pregnancy complicated by SGA (small for gestational age), antepartum 06/20/2014  . Chlamydia infection during pregnancy, antepartum 06/20/2014  . Late prenatal care starting at 27 weeks 06/20/2014  . Rubella non-immune status, needs MMR postpartum 06/20/2014    Assessment / Plan: 24 y.o. [id number] at [redacted]w[redacted]d here for IOL 2/2 previous IUFD @ 34w  Labor: continue pitocin, AROM for augmentation Fetal Wellbeing:  Cat I Pain Control:  Nothing currently Anticipated MOD:  SVD  Dorie Ohms ROCIO, MD 08/29/2014, 5:56 PM

## 2014-08-30 ENCOUNTER — Encounter (HOSPITAL_COMMUNITY): Payer: Self-pay | Admitting: *Deleted

## 2014-08-30 LAB — RPR: RPR Ser Ql: NONREACTIVE

## 2014-08-30 MED ORDER — MEASLES, MUMPS & RUBELLA VAC ~~LOC~~ INJ
0.5000 mL | INJECTION | Freq: Once | SUBCUTANEOUS | Status: AC
  Administered 2014-08-31: 0.5 mL via SUBCUTANEOUS
  Filled 2014-08-30 (×2): qty 0.5

## 2014-08-30 NOTE — Progress Notes (Signed)
Clinical Social Work Department BRIEF PSYCHOSOCIAL ASSESSMENT 08/30/2014  Patient:  [name] Reyes,[name]     Account Number:  [id number]     Admit date:  08/29/2014  Clinical Social Worker:  Analeah Brame, CLINICAL SOCIAL WORKER  Date/Time:  08/30/2014 02:45 PM  Referred by:  RN  Date Referred:  08/30/2014 Referred for  Other - See comment   Other Referral:   Late and limited prenatal care   Interview type:  Family  PSYCHOSOCIAL DATA Living Status:  FAMILY- lives with the FOB, two daughters, and MGM.  Primary support name:  [name] Reyes Primary support relationship to patient:  PARTNER Degree of support available:   MOB endorsed strong family support.   CURRENT CONCERNS Current Concerns  None Noted   SOCIAL WORK ASSESSMENT / PLAN CSW met with the MOB due to late and limited prenatal care (initiated care after 27 weeks).  MOB presented in a pleasant mood, displayed a full range in affect, and was bonding/interacting with the infant.  MOB denied questions, concerns, or needs as she transitions into the postpartum period. She discussed eagerness to return home, and shared that she is looking forward to having a son since her first two children are girls.  MOB discussed positive support system, and denied feeling overwhelmed secondary to transition to three children.    MOB acknowledged reason for CSW consult.  She did not identify a specific reason for late and limited prenatal care. She stated that there were some difficulties gaining access to transportation, but stated that the issue has since been resolved. MOB shared belief that there will be no barriers to attending follow up appointments for either herself or the infant. She verbalized understanding of hospital drug screen policy, and denied any substance use during the pregnancy.   Assessment/plan status:  No Further Intervention Required/No barriers to discharge. Other assessment/ plan:   Urine and meconium collected due to late and  limited prenatal care. Infant's UDS is negative.  MDS is pending.   CSW will notify CPS if MDS is positive.     MOB and FOB educated on hospital drug screen policy.   Information/referral to community resources:   No referrals needed.   PATIENT'S/FAMILY'S RESPONSE TO PLAN OF CARE: MOB and FOB acknowledged hosptial drug screen policy, verbalized understanding, and denied additional questions or concerns.        

## 2014-08-30 NOTE — Progress Notes (Signed)
UR chart review completed.  

## 2014-08-30 NOTE — Progress Notes (Signed)
Post Partum Day 1 Subjective: [name] Reyes is a 24 year old [id number] PPD#1 after SVD. She is up and moving with no problems. She reports mild cramping that is well controlled with pain medications. She reports scant vaginal bleeding. She has voided and passed gas but no bowel movements. Tolerating PO well. She had a boy and does not want a circumcision. She is bottle feeding. She plans on getting nexplanon outpatient for contraception.   Objective: Blood pressure 114/67, pulse 72, temperature 98.9 F (37.2 C), temperature source Oral, resp. rate 18, height 5' 0.5" (1.537 m), weight 68.493 kg (151 lb), last menstrual period 11/29/2013, SpO2 100 %, unknown if currently breastfeeding.  Physical Exam:  General: alert and no distress Lochia: appropriate Uterine Fundus: firm Incision: N/A DVT Evaluation: No evidence of DVT seen on physical exam.   Recent Labs  08/29/14 0845  HGB 10.9*  HCT 33.1*    Assessment/Plan: Plan for discharge tomorrow and Contraception nexplanon Needs MMR vaccine   LOS: 1 day   Andra Heslin M 08/30/2014, 7:23 AM

## 2014-08-30 NOTE — Lactation Note (Addendum)
This note was copied from the chart of [name] Reyes. Lactation Consultation Note  Baby latched in cradle position.  Lips flanged, sucks and swallows observed. Reviewed how to massage breast to keep him active and discussed cluster feeding. Mother denies soreness or questions..  Suggest she undress baby to diaper to feed if he continues to be sleepy. Mom encouraged to feed baby 8-12 times/24 hours and with feeding cues.     Patient Name: [name] Reyes [id number]'s Date: 08/30/2014 Reason for consult: Follow-up assessment   Maternal Data    Feeding Feeding Type: Breast Fed Length of feed: 15 min  LATCH Score/Interventions Latch: Grasps breast easily, tongue down, lips flanged, rhythmical sucking.  Audible Swallowing: A few with stimulation  Type of Nipple: Everted at rest and after stimulation  Comfort (Breast/Nipple): Soft / non-tender     Hold (Positioning): No assistance needed to correctly position infant at breast.  LATCH Score: 9  Lactation Tools Discussed/Used     Consult Status Consult Status: Follow-up Date: 08/31/14 Follow-up type: In-patient    Dahlia ByesBerkelhammer, Rendell Thivierge [facility] 08/30/2014, 4:15 PM

## 2014-08-30 NOTE — Lactation Note (Signed)
This note was copied from the chart of [name] Reyes. Lactation Consultation Note Mom has 2 other children but didn't BF them. States baby is latching well. Hand expression demonstrated w/noted colostrum. Mom happy to see colostrum. Encouraged hand massage during BF. Baby sleeping. Denied need for interpreter for teaching. Mom encouraged to feed baby 8-12 times/24 hours and with feeding cues. Mom encouraged to do skin-to-skin. Mom encouraged to waken baby for feeds. Referred to Baby and Me Book in Breastfeeding section Pg. 22-23 for position options and Proper latch demonstration.WH/LC brochure given w/resources, support groups and LC services.Encouraged comfort during BF so colostrum flows better and mom will enjoy the feeding longer. Taking deep breaths and breast massage during BF.  Patient Name: [name] Reyes Today's Date: 08/30/2014 Reason for consult: Initial assessment   Maternal Data Has patient been taught Hand Expression?: Yes Does the patient have breastfeeding experience prior to this delivery?: No  Feeding Feeding Type: Breast Fed Length of feed: 10 min  LATCH Score/Interventions Latch: Repeated attempts needed to sustain latch, nipple held in mouth throughout feeding, stimulation needed to elicit sucking reflex. Intervention(s): Adjust position;Assist with latch  Audible Swallowing: None Intervention(s): Skin to skin (encouraged)  Type of Nipple: Everted at rest and after stimulation  Comfort (Breast/Nipple): Soft / non-tender     Hold (Positioning): Assistance needed to correctly position infant at breast and maintain latch.  LATCH Score: 6  Lactation Tools Discussed/Used     Consult Status Consult Status: Follow-up Date: 08/31/14 Follow-up type: In-patient    Charyl DancerCARVER, Marveline Profeta G 08/30/2014, 4:25 AM

## 2014-08-31 NOTE — Discharge Instructions (Signed)

## 2014-08-31 NOTE — Discharge Summary (Signed)
Obstetric Discharge Summary Reason for Admission: induction of labor 2/2 hx of IUFD Prenatal Procedures: NST, CST and ultrasound Intrapartum Procedures: spontaneous vaginal delivery Postpartum Procedures: none Complications-Operative and Postpartum: none HEMOGLOBIN  Date Value Ref Range Status  08/29/2014 10.9* 12.0 - 15.0 g/dL Final  29/56/213011/16/2015 86.510.0 g/dL Final   HCT  Date Value Ref Range Status  08/29/2014 33.1* 36.0 - 46.0 % Final  06/03/2014 31 % Final    Physical Exam:  General: alert, cooperative, appears stated age and no distress Lochia: appropriate Uterine Fundus: firm Incision: n/a DVT Evaluation: No evidence of DVT seen on physical exam. No cords or calf tenderness. No significant calf/ankle edema.  Discharge Diagnoses: Term Pregnancy-delivered  Discharge Information: Date: 08/31/2014 Activity: pelvic rest Diet: routine Medications: PNV Condition: stable Instructions: refer to practice specific booklet Discharge to: home Follow-up Information    [facility] Schedule an appointment as soon as possible for a visit in 6 weeks.   Specialty:  Obstetrics and Gynecology   Contact information:   [address] [place] [id number] [phone]      Newborn Data: Live born female  Birth Weight: 7 lb 8.5 oz (3415 g) APGAR: 8, 9  Home with mother.  Kathee DeltonMcKeag, Ian D 08/31/2014, 7:44 AM   I have seen and examined this patient and I agree with the above. Cam HaiSHAW, Geovanna Simko CNM 8:59 AM 08/31/2014

## 2014-09-13 ENCOUNTER — Encounter: Payer: Self-pay | Admitting: General Practice

## 2014-10-17 ENCOUNTER — Ambulatory Visit: Payer: Self-pay | Admitting: Physician Assistant

## 2014-11-18 ENCOUNTER — Ambulatory Visit: Payer: Self-pay | Admitting: Obstetrics & Gynecology

## 2014-12-19 ENCOUNTER — Ambulatory Visit: Payer: Medicaid Other | Admitting: Obstetrics and Gynecology

## 2015-05-05 LAB — OB RESULTS CONSOLE HGB/HCT, BLOOD
HEMATOCRIT: 33 %
HEMOGLOBIN: 11 g/dL

## 2015-05-05 LAB — OB RESULTS CONSOLE PLATELET COUNT: Platelets: 234 10*3/uL

## 2015-05-05 LAB — OB RESULTS CONSOLE GC/CHLAMYDIA
Chlamydia: NEGATIVE
Gonorrhea: NEGATIVE

## 2015-05-05 LAB — OB RESULTS CONSOLE HIV ANTIBODY (ROUTINE TESTING): HIV: NONREACTIVE

## 2015-05-05 LAB — OB RESULTS CONSOLE RPR: RPR: NONREACTIVE

## 2015-05-10 ENCOUNTER — Encounter: Payer: Self-pay | Admitting: *Deleted

## 2015-05-12 ENCOUNTER — Other Ambulatory Visit (HOSPITAL_COMMUNITY): Payer: Self-pay | Admitting: Obstetrics and Gynecology

## 2015-05-12 DIAGNOSIS — Z3689 Encounter for other specified antenatal screening: Secondary | ICD-10-CM

## 2015-05-13 ENCOUNTER — Ambulatory Visit (HOSPITAL_COMMUNITY)
Admission: RE | Admit: 2015-05-13 | Discharge: 2015-05-13 | Disposition: A | Discharge status: Home / Self Care | Payer: Medicaid Other | Source: Ambulatory Visit | Attending: Obstetrics and Gynecology | Admitting: Obstetrics and Gynecology

## 2015-05-13 DIAGNOSIS — Z36 Encounter for antenatal screening of mother: Secondary | ICD-10-CM | POA: Insufficient documentation

## 2015-05-13 DIAGNOSIS — Z3689 Encounter for other specified antenatal screening: Secondary | ICD-10-CM

## 2015-05-29 ENCOUNTER — Encounter: Payer: Self-pay | Admitting: Family

## 2015-05-29 ENCOUNTER — Encounter: Payer: Medicaid Other | Admitting: Family

## 2015-06-11 ENCOUNTER — Encounter: Payer: Self-pay | Admitting: Family Medicine

## 2015-06-11 ENCOUNTER — Encounter: Payer: Medicaid Other | Admitting: Family Medicine

## 2015-06-30 ENCOUNTER — Encounter: Payer: Self-pay | Admitting: *Deleted

## 2015-06-30 ENCOUNTER — Ambulatory Visit (INDEPENDENT_AMBULATORY_CARE_PROVIDER_SITE_OTHER): Payer: Self-pay | Admitting: Obstetrics and Gynecology

## 2015-06-30 ENCOUNTER — Encounter: Payer: Self-pay | Admitting: Obstetrics and Gynecology

## 2015-06-30 VITALS — BP 103/71 | HR 78 | Temp 98.0°F | Wt 144.0 lb

## 2015-06-30 DIAGNOSIS — O09299 Supervision of pregnancy with other poor reproductive or obstetric history, unspecified trimester: Secondary | ICD-10-CM

## 2015-06-30 DIAGNOSIS — O09893 Supervision of other high risk pregnancies, third trimester: Secondary | ICD-10-CM

## 2015-06-30 DIAGNOSIS — O09293 Supervision of pregnancy with other poor reproductive or obstetric history, third trimester: Secondary | ICD-10-CM

## 2015-06-30 DIAGNOSIS — O0930 Supervision of pregnancy with insufficient antenatal care, unspecified trimester: Secondary | ICD-10-CM | POA: Insufficient documentation

## 2015-06-30 DIAGNOSIS — O0993 Supervision of high risk pregnancy, unspecified, third trimester: Secondary | ICD-10-CM | POA: Insufficient documentation

## 2015-06-30 DIAGNOSIS — O0933 Supervision of pregnancy with insufficient antenatal care, third trimester: Secondary | ICD-10-CM

## 2015-06-30 LAB — POCT URINALYSIS DIP (DEVICE)
BILIRUBIN URINE: NEGATIVE
Glucose, UA: NEGATIVE mg/dL
HGB URINE DIPSTICK: NEGATIVE
Ketones, ur: NEGATIVE mg/dL
LEUKOCYTES UA: NEGATIVE
NITRITE: NEGATIVE
PH: 7.5 (ref 5.0–8.0)
Protein, ur: NEGATIVE mg/dL
SPECIFIC GRAVITY, URINE: 1.02 (ref 1.005–1.030)
Urobilinogen, UA: 1 mg/dL (ref 0.0–1.0)

## 2015-06-30 NOTE — Progress Notes (Signed)
[facility] [place] [facility]

## 2015-06-30 NOTE — Progress Notes (Signed)
   Subjective:    [name] Reyes is a [id number] [redacted]w[redacted]d being seen today for her first obstetrical visit.  Her obstetrical history is significant for h/o IUFD at 21 weeks with first pregnancy and short interval between pregnancies. Patient does not intend to breast feed. Pregnancy history fully reviewed.  Patient reports no complaints.  Filed Vitals:   06/30/15 1056  BP: 103/71  Pulse: 78  Temp: 98 F (36.7 C)  Weight: 144 lb (65.318 kg)    HISTORY: OB History  Gravida Para Term Preterm AB SAB TAB Ectopic Multiple Living  $Remov'5 4 3 1     'OiFgjF$ 0 3    # Outcome Date GA Lbr Len/2nd Weight Sex Delivery Anes PTL Lv  5 Current           4 Term 08/29/14 [redacted]w[redacted]d  7 lb 8.5 oz (3.415 kg) [address] EPI  Y  3 Term 07/19/10 [redacted]w[redacted]d  7 lb 4 oz (3.289 kg) F Vag-Spont   Y  2 Term 05/13/09 [redacted]w[redacted]d  5 lb 6 oz (2.438 kg) F Vag-Spont EPI  Y  1 Preterm 04/30/05 [redacted]w[redacted]d   M Vag-Spont   ND     Past Medical History  Diagnosis Date  . Medical history non-contributory   . Vaginal Pap smear, abnormal     colpo 2010   Past Surgical History  Procedure Laterality Date  . No past surgeries     Family History  Problem Relation Age of Onset  . Alcohol abuse Neg Hx   . Arthritis Neg Hx   . Asthma Neg Hx   . Birth defects Neg Hx   . Cancer Neg Hx   . COPD Neg Hx   . Depression Neg Hx   . Diabetes Neg Hx   . Drug abuse Neg Hx   . Early death Neg Hx   . Hearing loss Neg Hx   . Heart disease Neg Hx   . Hyperlipidemia Neg Hx   . Hypertension Neg Hx   . Kidney disease Neg Hx   . Learning disabilities Neg Hx   . Mental illness Neg Hx   . Mental retardation Neg Hx   . Miscarriages / Stillbirths Neg Hx   . Stroke Neg Hx   . Varicose Veins Neg Hx   . Vision loss Paternal Grandmother      Exam        Assessment:    Pregnancy: [id number] Patient Active Problem List   Diagnosis Date Noted  . Supervision of high risk pregnancy in third trimester 06/30/2015  . Short interval between pregnancies  complicating pregnancy in third trimester, antepartum 06/30/2015  . Prior pregnancy with fetal demise 08/29/2014  . Prior pregnancy with fetal demise at 1 weeks, antepartum 06/20/2014  . Prior pregnancy complicated by SGA (small for gestational age), antepartum 06/20/2014  . Chlamydia infection during pregnancy, antepartum 06/20/2014  . Rubella non-immune status, needs MMR postpartum 06/20/2014        Plan:     Initial labs drawn. Prenatal vitamins. Problem list reviewed and updated. Genetic Screening discussed : records not available for review.  Ultrasound discussed; fetal survey: results reviewed. Patient to follow up this week for NST Patient understands that she will be induced at 39 weeks She plans Nexplanon for contraception  Follow up in 1 weeks. 50% of 52min visit spent on counseling and coordination of care.     CONSTANT,PEGGY 06/30/2015

## 2015-07-20 NOTE — L&D Delivery Note (Signed)
Delivery Note At 1544 the membranes were artificially ruptured for clear fluid, cx was complete and vtx @ +1 station. Pt pushed well and at 3:56 PM a viable female was delivered via Vaginal, Spontaneous Delivery (Presentation: Right Occiput Posterior).  APGAR: 9, 9; weight 6 lb 8.2 oz (2955 g). Cord subjectively short, not allowing infant to reach to pt's abd, so it was clamped and cut by FOB. Hospital cord blood sample collected.  Placenta status: Intact, Spontaneous.  Cord: 3 vessels   Anesthesia: None  Episiotomy: None Lacerations: None Est. Blood Loss (mL): 50  Mom to postpartum.  Baby to Couplet care / Skin to Skin.   Cam Hai CNM 08/15/2015, 4:14 PM

## 2015-08-15 ENCOUNTER — Inpatient Hospital Stay (HOSPITAL_COMMUNITY)
Admission: AD | Admit: 2015-08-15 | Discharge: 2015-08-17 | DRG: 775 | Disposition: A | Discharge status: Home / Self Care | Payer: Self-pay | Source: Ambulatory Visit | Attending: Obstetrics & Gynecology | Admitting: Obstetrics & Gynecology

## 2015-08-15 ENCOUNTER — Encounter (HOSPITAL_COMMUNITY): Payer: Self-pay | Admitting: *Deleted

## 2015-08-15 DIAGNOSIS — Z87891 Personal history of nicotine dependence: Secondary | ICD-10-CM

## 2015-08-15 DIAGNOSIS — O0993 Supervision of high risk pregnancy, unspecified, third trimester: Secondary | ICD-10-CM

## 2015-08-15 DIAGNOSIS — O99824 Streptococcus B carrier state complicating childbirth: Secondary | ICD-10-CM | POA: Diagnosis present

## 2015-08-15 DIAGNOSIS — Z3A39 39 weeks gestation of pregnancy: Secondary | ICD-10-CM

## 2015-08-15 DIAGNOSIS — O0930 Supervision of pregnancy with insufficient antenatal care, unspecified trimester: Secondary | ICD-10-CM

## 2015-08-15 DIAGNOSIS — O09893 Supervision of other high risk pregnancies, third trimester: Secondary | ICD-10-CM

## 2015-08-15 DIAGNOSIS — IMO0001 Reserved for inherently not codable concepts without codable children: Secondary | ICD-10-CM

## 2015-08-15 LAB — CBC
HCT: 35.6 % — ABNORMAL LOW (ref 36.0–46.0)
HEMOGLOBIN: 11.8 g/dL — AB (ref 12.0–15.0)
MCH: 28 pg (ref 26.0–34.0)
MCHC: 33.1 g/dL (ref 30.0–36.0)
MCV: 84.4 fL (ref 78.0–100.0)
PLATELETS: 186 10*3/uL (ref 150–400)
RBC: 4.22 MIL/uL (ref 3.87–5.11)
RDW: 15.1 % (ref 11.5–15.5)
WBC: 8.8 10*3/uL (ref 4.0–10.5)

## 2015-08-15 LAB — TYPE AND SCREEN
ABO/RH(D): A POS
ANTIBODY SCREEN: NEGATIVE

## 2015-08-15 MED ORDER — LIDOCAINE HCL (PF) 1 % IJ SOLN
INTRAMUSCULAR | Status: DC
Start: 2015-08-15 — End: 2015-08-15
  Filled 2015-08-15: qty 30

## 2015-08-15 MED ORDER — OXYTOCIN 10 UNIT/ML IJ SOLN
2.5000 [IU]/h | INTRAMUSCULAR | Status: DC
  Administered 2015-08-15: 2.5 [IU]/h via INTRAVENOUS

## 2015-08-15 MED ORDER — PRENATAL MULTIVITAMIN CH
1.0000 | ORAL_TABLET | Freq: Every day | ORAL | Status: DC
  Administered 2015-08-16: 1 via ORAL
  Filled 2015-08-15: qty 1

## 2015-08-15 MED ORDER — DIBUCAINE 1 % RE OINT: 1.0000 "application " | TOPICAL_OINTMENT | RECTAL | Status: DC | PRN

## 2015-08-15 MED ORDER — OXYTOCIN BOLUS FROM INFUSION
500.0000 mL | INTRAVENOUS | Status: DC
  Administered 2015-08-15: 500 mL via INTRAVENOUS

## 2015-08-15 MED ORDER — OXYTOCIN 10 UNIT/ML IJ SOLN
INTRAVENOUS | Status: AC
  Administered 2015-08-15: 2.5 [IU]/h via INTRAVENOUS
  Filled 2015-08-15: qty 4

## 2015-08-15 MED ORDER — OXYCODONE-ACETAMINOPHEN 5-325 MG PO TABS
2.0000 | ORAL_TABLET | ORAL | Status: DC | PRN
Start: 2015-08-15 — End: 2015-08-15

## 2015-08-15 MED ORDER — LIDOCAINE HCL (PF) 1 % IJ SOLN: 30.0000 mL | INTRAMUSCULAR | Status: DC | PRN

## 2015-08-15 MED ORDER — ONDANSETRON HCL 4 MG PO TABS
4.0000 mg | ORAL_TABLET | ORAL | Status: DC | PRN
Start: 2015-08-15 — End: 2015-08-17

## 2015-08-15 MED ORDER — WITCH HAZEL-GLYCERIN EX PADS: 1.0000 "application " | MEDICATED_PAD | CUTANEOUS | Status: DC | PRN

## 2015-08-15 MED ORDER — SENNOSIDES-DOCUSATE SODIUM 8.6-50 MG PO TABS
2.0000 | ORAL_TABLET | ORAL | Status: DC
  Administered 2015-08-15 – 2015-08-16 (×2): 2 via ORAL
  Filled 2015-08-15 (×2): qty 2

## 2015-08-15 MED ORDER — ONDANSETRON HCL 4 MG/2ML IJ SOLN: 4.0000 mg | INTRAMUSCULAR | Status: DC | PRN

## 2015-08-15 MED ORDER — SIMETHICONE 80 MG PO CHEW: 80.0000 mg | CHEWABLE_TABLET | ORAL | Status: DC | PRN

## 2015-08-15 MED ORDER — BENZOCAINE-MENTHOL 20-0.5 % EX AERO: 1.0000 "application " | INHALATION_SPRAY | CUTANEOUS | Status: DC | PRN

## 2015-08-15 MED ORDER — OXYTOCIN 10 UNIT/ML IJ SOLN
INTRAMUSCULAR | Status: AC
  Filled 2015-08-15: qty 1

## 2015-08-15 MED ORDER — TETANUS-DIPHTH-ACELL PERTUSSIS 5-2.5-18.5 LF-MCG/0.5 IM SUSP: 0.5000 mL | Freq: Once | INTRAMUSCULAR | Status: DC

## 2015-08-15 MED ORDER — LANOLIN HYDROUS EX OINT: TOPICAL_OINTMENT | CUTANEOUS | Status: DC | PRN

## 2015-08-15 MED ORDER — ACETAMINOPHEN 325 MG PO TABS: 650.0000 mg | ORAL_TABLET | ORAL | Status: DC | PRN

## 2015-08-15 MED ORDER — OXYCODONE-ACETAMINOPHEN 5-325 MG PO TABS
1.0000 | ORAL_TABLET | ORAL | Status: DC | PRN
  Administered 2015-08-15 – 2015-08-16 (×2): 1 via ORAL
  Filled 2015-08-15 (×2): qty 1

## 2015-08-15 MED ORDER — CITRIC ACID-SODIUM CITRATE 334-500 MG/5ML PO SOLN: 30.0000 mL | ORAL | Status: DC | PRN

## 2015-08-15 MED ORDER — LACTATED RINGERS IV SOLN: 500.0000 mL | INTRAVENOUS | Status: DC | PRN

## 2015-08-15 MED ORDER — DIPHENHYDRAMINE HCL 25 MG PO CAPS: 25.0000 mg | ORAL_CAPSULE | Freq: Four times a day (QID) | ORAL | Status: DC | PRN

## 2015-08-15 MED ORDER — IBUPROFEN 600 MG PO TABS
600.0000 mg | ORAL_TABLET | Freq: Four times a day (QID) | ORAL | Status: DC
  Administered 2015-08-15 – 2015-08-17 (×7): 600 mg via ORAL
  Filled 2015-08-15 (×7): qty 1

## 2015-08-15 MED ORDER — OXYCODONE-ACETAMINOPHEN 5-325 MG PO TABS: 1.0000 | ORAL_TABLET | ORAL | Status: DC | PRN

## 2015-08-15 MED ORDER — ZOLPIDEM TARTRATE 5 MG PO TABS: 5.0000 mg | ORAL_TABLET | Freq: Every evening | ORAL | Status: DC | PRN

## 2015-08-15 MED ORDER — LACTATED RINGERS IV SOLN: INTRAVENOUS | Status: DC

## 2015-08-15 MED ORDER — ONDANSETRON HCL 4 MG/2ML IJ SOLN: 4.0000 mg | Freq: Four times a day (QID) | INTRAMUSCULAR | Status: DC | PRN

## 2015-08-15 NOTE — H&P (Signed)
[name] Reyes is a 25 y.o. female [phone] @ 39.1 by LMP presenting for contractions. Reports +FM. Denies leaking or bldg. Her preg was initially followed by the [facility] w/ onset of care @ 24wks w/ transfer to [facility] due to hx 34wk IUFD (1st preg). She had a single visit at the [facility] @ 32wks. Her preg hx is also remarkable for 1) short interval between preg 2) SGA w 2nd preg 3) GBS unk  History OB History    Gravida Para Term Preterm AB TAB SAB Ectopic Multiple Living   0 0 0 0 0 4     Past Medical History  Diagnosis Date  . Medical history non-contributory   . Vaginal Pap smear, abnormal     colpo 2010   Past Surgical History  Procedure Laterality Date  . No past surgeries     Family History: family history includes Vision loss in her paternal grandmother. There is no history of Alcohol abuse, Arthritis, Asthma, Birth defects, Cancer, COPD, Depression, Diabetes, Drug abuse, Early death, Hearing loss, Heart disease, Hyperlipidemia, Hypertension, Kidney disease, Learning disabilities, Mental illness, Mental retardation, Miscarriages / Stillbirths, Stroke, or Varicose Veins. Social History:  reports that she has quit smoking. Her smoking use included Cigarettes. She has never used smokeless tobacco. She reports that she does not drink alcohol or use illicit drugs.   Prenatal Transfer Tool  Maternal Diabetes: No Genetic Screening: Declined- too late Maternal Ultrasounds/Referrals: Normal Fetal Ultrasounds or other Referrals:  None Maternal Substance Abuse:  No Significant Maternal Medications:  None Significant Maternal Lab Results:  Lab values include: Other:  GBS unk Other Comments:  None  ROS  Dilation: 10 Effacement (%): 100 Exam by:: Philipp Deputy, CNM Blood pressure 137/79, pulse 55, temperature 98.2 F (36.8 C), temperature source Oral, resp. rate 20, last menstrual period 11/14/2014, unknown if currently breastfeeding. Exam Physical Exam  Constitutional: She is oriented to  person, place, and time. She appears well-developed.  HENT:  Head: Normocephalic.  Neck: Normal range of motion.  Cardiovascular: Normal rate.   Respiratory: Effort normal.  GI:  EFM 130s, +accels, occ mi variables Ctx q 3 mins  Genitourinary: Vagina normal.  Musculoskeletal: Normal range of motion.  Neurological: She is alert and oriented to person, place, and time.  Skin: Skin is warm and dry.  Psychiatric: She has a normal mood and affect. Her behavior is normal. Thought content normal.    Prenatal labs: ABO, Rh: --/--/A POS (01/27 1540) Antibody: NEG (01/27 1540) Rubella:   RPR: Nonreactive (10/17 0000)  HBsAg:    HIV: Non-reactive (10/17 0000)  GBS: Positive (02/08 0000)   Assessment/Plan: IUP@term  Active labor/transition GBS unk  Admit to [facility] Expectant management Anticipate SVD   Cam Hai CNM 08/15/2015, 5:38 PM

## 2015-08-15 NOTE — MAU Note (Signed)
Pt reports she feels the urge to push. assisted pt to room. SVE 10/100/-2 with BBOW. FHR 135  Provider notified and accompanied transport to room 165

## 2015-08-16 LAB — RPR: RPR Ser Ql: NONREACTIVE

## 2015-08-16 NOTE — Lactation Note (Signed)
This note was copied from the chart of Boy Latreece Mochizuki. Lactation Consultation Note  Patient Name: Boy Maryhelen Lindler Today's Date: 08/16/2015 Reason for consult: Initial assessment Infant is 25 hours old & seen by [facility] for initial assessment. Baby was born @ [redacted]w[redacted]d & weighed 6+8.2#. Baby last fed ~4:25pm for ~20 mins. Baby has had 4 wet & 2 stool diapers. Baby has BF 5x & received formula 4x. Baby was asleep in crib with pacifier in mouth when LC entered. Mom reports she BF her other son for ~3 months and did both BF & formula and stated her milk decreased so she switched to just formula. Mom has been BF & formula feeding with this baby as well but stated that BF is going better so far than it did with her first child so may try doing just BF. Praised mom on this idea & encouraged her to continue to put baby to breast with feeding cues & at least 8-12x/d. Mom stated that her nurse taught her how to do hand expression & was happy when she saw milk. Provided mom with a hand pump for occasional use. Mom stated she has WIC. Provided mom with BF booklet, feeding logs, & BF resources; discussed outpatient services & support group. Discussed NB stomach size & discussed how it's best to avoid pacifiers in the beginning so not to mask hunger cues. Mom reports no other questions. Encouraged mom to ask for [facility] tomorrow to assess latch if needed.  Maternal Data    Feeding Feeding Type: Breast Fed Length of feed: 20 min (per mom)  LATCH Score/Interventions Latch: Grasps breast easily, tongue down, lips flanged, rhythmical sucking.  Audible Swallowing: A few with stimulation Intervention(s): Skin to skin;Hand expression (large amount of colostum expressed easily) Intervention(s): Skin to skin;Hand expression  Type of Nipple: Everted at rest and after stimulation  Comfort (Breast/Nipple): Soft / non-tender     Hold (Positioning): Assistance needed to correctly position infant at breast and maintain  latch.  LATCH Score: 8  Lactation Tools Discussed/Used WIC Program: Yes   Consult Status Consult Status: Follow-up Date: 08/17/15 Follow-up type: In-patient    Oneal Grout 08/16/2015, 5:52 PM

## 2015-08-16 NOTE — Progress Notes (Signed)
Post Partum Day #1 Subjective: no complaints, up ad lib and tolerating PO; bottlefeeding; plans Nexplanon for contraception  Objective: Blood pressure 123/78, pulse 78, temperature 98.4 F (36.9 C), temperature source Oral, resp. rate 16, height  (1.549 m), weight 66.679 kg (147 lb), last menstrual period 11/14/2014, unknown if currently breastfeeding.  Physical Exam:  General: alert, cooperative and no distress Lochia: appropriate Uterine Fundus: firm Incision: N/A DVT Evaluation: No evidence of DVT seen on physical exam.   Recent Labs  08/15/15 1540  HGB 11.8*  HCT 35.6*    Assessment/Plan: Plan for discharge tomorrow   LOS: 1 day   Cam Hai CNM 08/16/2015, 7:37 AM

## 2015-08-17 MED ORDER — MEASLES, MUMPS & RUBELLA VAC ~~LOC~~ INJ
0.5000 mL | INJECTION | Freq: Once | SUBCUTANEOUS | Status: AC
  Administered 2015-08-17: 0.5 mL via SUBCUTANEOUS
  Filled 2015-08-17: qty 0.5

## 2015-08-17 MED ORDER — IBUPROFEN 600 MG PO TABS: 600.0000 mg | ORAL_TABLET | Freq: Four times a day (QID) | ORAL | Status: DC | PRN

## 2015-08-17 MED ORDER — SENNOSIDES-DOCUSATE SODIUM 8.6-50 MG PO TABS: 1.0000 | ORAL_TABLET | Freq: Every day | ORAL | Status: DC

## 2015-08-17 NOTE — Progress Notes (Signed)
Resident called States patient can receive rubella vaccine although had it in Feb 2016 but not to give TDAP (had in Feb) or Flu ( patient states had in October at [facility])

## 2015-08-17 NOTE — Discharge Summary (Signed)
OB Discharge Summary     Patient Name: [name] Reyes DOB: 1991-03-29 MRN: [id number]  Date of admission: 08/15/2015 Delivering MD: Serita Grammes D   Date of discharge: 08/17/2015  Admitting diagnosis: 39w ctx every 5 min or less, possible water broke Intrauterine pregnancy: [redacted]w[redacted]d    Secondary diagnosis:  Active Problems:   Active labor at term  Additional problems:  GBS unknown: inadequate time to treat  Rubella Nonimmune- patient received MMR prior to discharge     Discharge diagnosis: Term Pregnancy Delivered                                                                                                Post partum procedures:none  Augmentation: AROM  Complications: None  Hospital course:  Onset of Labor With Vaginal Delivery     25y.o. yo [id number] 25w1das admitted in Active Labor on 08/15/2015. Patient had an uncomplicated labor course as follows:  Membrane Rupture Time/Date: 3:44 PM ,08/15/2015   Intrapartum Procedures: Episiotomy: None [1]                                         Lacerations:  None [1]  Patient had a delivery of a Viable infant. 08/15/2015  Information for the patient's newborn:  [name] Reyes, [id number]]Delivery Method: Vaginal, Spontaneous Delivery (Filed from Delivery Summary)    Will be given MMR prior to d/c given rubella not immune.  Pateint had an uncomplicated postpartum course.  She is ambulating, tolerating a regular diet, passing flatus, and urinating well. Patient is discharged home in stable condition on 08/17/2015.    Physical exam  Filed Vitals:   08/15/15 1855 08/15/15 2323 08/16/15 1824 08/17/15 0533  BP: 125/98 123/78 119/62 118/72  Pulse: 61 78 71 64  Temp: 98.4 F (36.9 C) 98.4 F (36.9 C) 98.2 F (36.8 C) 97.5 F (36.4 C)  TempSrc: Axillary Oral Oral Oral  Resp: '16 16 16 18  '$ Height:      Weight:      SpO2:   99%    General: alert, cooperative and no distress Lochia: appropriate Uterine Fundus:  firm Incision: N/A DVT Evaluation: No evidence of DVT seen on physical exam. No cords or calf tenderness. No significant calf/ankle edema. Labs: Lab Results  Component Value Date   WBC 8.8 08/15/2015   HGB 11.8* 08/15/2015   HCT 35.6* 08/15/2015   MCV 84.4 08/15/2015   PLT 186 08/15/2015   No flowsheet data found.  Discharge instruction: per After Visit Summary and "Baby and Me Booklet".  After visit meds:    Medication List    TAKE these medications        calcium carbonate 500 MG chewable tablet  Commonly known as:  TUMS - dosed in mg elemental calcium  Chew 1 tablet by mouth daily as needed for indigestion or heartburn.     ibuprofen 600 MG tablet  Commonly known as:  ADVIL,MOTRIN  Take 1 tablet (600  mg total) by mouth every 6 (six) hours as needed for mild pain, moderate pain or cramping.     prenatal vitamin w/FE, FA 27-1 MG Tabs tablet  Take 1 tablet by mouth daily at 12 noon.     senna-docusate 8.6-50 MG tablet  Commonly known as:  Senokot-S  Take 1 tablet by mouth at bedtime.        Diet: routine diet  Activity: Advance as tolerated. Pelvic rest for 6 weeks.   Outpatient follow up:6 weeks Follow up Appt:No future appointments. Follow up Visit:No Follow-up on file.  Postpartum contraception: Nexplanon  Newborn Data: Live born female  Birth Weight: 6 lb 8.2 oz (2955 g) APGAR: 9, 9  Baby Feeding: Bottle Disposition:home with mother   08/17/2015 Smiley Houseman, MD  PGY 1 Family Medicine   OB FELLOW DISCHARGE NOTE ATTESTATION  I have seen and examined this patient and agree with above documentation in the resident's note.   Desma Maxim, MD 8:50 AM

## 2015-08-17 NOTE — Discharge Instructions (Signed)
Parto vaginal, Cuidados posteriores  °(Vaginal Delivery, Care After) °Siga estas instrucciones durante las próximas semanas. Estas indicaciones para el alta le proporcionan información general acerca de cómo deberá cuidarse después del parto. El médico también podrá darle instrucciones específicas. El tratamiento ha sido planificado según las prácticas médicas actuales, pero en algunos casos pueden ocurrir problemas. Comuníquese con el médico si tiene algún problema o tiene preguntas al volver a su casa.  °INSTRUCCIONES PARA EL CUIDADO EN EL HOGAR  °· Tome sólo medicamentos de venta libre o recetados, según las indicaciones del médico o del farmacéutico. °· No beba alcohol, especialmente si está amamantando o toma analgésicos. °· No mastique tabaco ni fume. °· No consuma drogas. °· Continúe con un adecuado cuidado perineal. El buen cuidado perineal incluye: °¨ Higienizarse de adelante hacia atrás. °¨ Mantener la zona perineal limpia. °· No use tampones ni duchas vaginales hasta que su médico la autorice. °· Dúchese, lávese el cabello y tome baños de inmersión según las indicaciones de su médico. °· Utilice un sostén que le ajuste bien y que brinde buen soporte a sus mamas. °· Consuma alimentos saludables. °· Beba suficiente líquido para mantener la orina clara o de color amarillo pálido. °· Consuma alimentos ricos en fibra como cereales y panes integrales, arroz, frijoles y frutas y verduras frescas todos los días. Estos alimentos pueden ayudarla a prevenir o aliviar el estreñimiento. °· Siga las recomendaciones de su médico relacionadas con la reanudación de actividades como subir escaleras, conducir automóviles, levantar objetos, hacer ejercicios o viajar. °· Hable con su médico acerca de reanudar la actividad sexual. Volver a la actividad sexual depende del riesgo de infección, la velocidad de la curación y la comodidad y su deseo de reanudarla. °· Trate de que alguien la ayude con las actividades del hogar y con  el recién nacido al menos durante un par de días después de salir del hospital. °· Descanse todo lo que pueda. Trate de descansar o tomar una siesta mientras el bebé está durmiendo. °· Aumente sus actividades gradualmente. °· Cumpla con todas las visitas de control programadas para después del parto. Es muy importante asistir a todas las citas programadas de seguimiento. En estas citas, su médico va a controlarla para asegurarse de que esté sanando física y emocionalmente. °SOLICITE ATENCIÓN MÉDICA SI:  °· Elimina coágulos grandes por la vagina. Guarde algunos coágulos para mostrarle al médico. °· Tiene una secreción con feo olor que proviene de la vagina. °· Tiene dificultad para orinar. °· Orina con frecuencia. °· Siente dolor al orinar. °· Nota un cambio en sus movimientos intestinales. °· Aumenta el enrojecimiento, el dolor o la hinchazón en la zona de la incisión vaginal (episiotomía) o el desgarro vaginal. °· Tiene pus que drena por la episiotomía o el desgarro vaginal. °· La episiotomía o el desgarro vaginal se abren. °· Sus mamas le duelen, están duras o enrojecidas. °· Sufre un dolor intenso de cabeza. °· Tiene visión borrosa o ve manchas. °· Se siente triste o deprimida. °· Tiene pensamientos acerca de lastimarse o dañar al recién nacido. °· Tiene preguntas acerca de su cuidado personal, el cuidado del recién nacido o acerca de los medicamentos. °· Se siente mareada o sufre un desmayo. °· Tiene una erupción. °· Tiene náuseas o vómitos. °· Usted amamantó al bebé y no ha tenido su período menstrual dentro de las 12 semanas después de dejar de amamantar. °· No amamanta al bebé y no tuvo su período menstrual en las últimas 12° semanas después del   partoLance Muss. SOLICITE ATENCIN MDICA DE INMEDIATO SI:   Siente dolor persistente.  Siente dolor en el pecho.  Le falta el aire.  Se desmaya.  Siente dolor en la pierna.  Siente Physiological scientist.  El sangrado vaginal satura dos o ms  apsitos en 1 hora.   Esta informacin no tiene Theme park manager el consejo del mdico. Asegrese de hacerle al mdico cualquier pregunta que tenga.   Document Released: 07/05/2005 Document Revised: 03/26/2015 Elsevier Interactive Patient Education 2016 Elsevier Inc.  Vaginal Delivery, Care After Refer to this sheet in the next few weeks. These discharge instructions provide you with information on caring for yourself after delivery. Your health care provider may also give you specific instructions. Your treatment has been planned according to the most current medical practices available, but problems sometimes occur. Call your health care provider if you have any problems or questions after you go home. HOME CARE INSTRUCTIONS  Take over-the-counter or prescription medicines only as directed by your health care provider or pharmacist.  Do not drink alcohol, especially if you are breastfeeding or taking medicine to relieve pain.  Do not chew or smoke tobacco.  Do not use illegal drugs.  Continue to use good perineal care. Good perineal care includes:  Wiping your perineum from front to back.  Keeping your perineum clean.  Do not use tampons or douche until your health care provider says it is okay.  Shower, wash your hair, and take tub baths as directed by your health care provider.  Wear a well-fitting bra that provides breast support.  Eat healthy foods.  Drink enough fluids to keep your urine clear or pale yellow.  Eat high-fiber foods such as whole grain cereals and breads, brown rice, beans, and fresh fruits and vegetables every day. These foods may help prevent or relieve constipation.  Follow your health care provider's recommendations regarding resumption of activities such as climbing stairs, driving, lifting, exercising, or traveling.  Talk to your health care provider about resuming sexual activities. Resumption of sexual activities is dependent upon your risk of  infection, your rate of healing, and your comfort and desire to resume sexual activity.  Try to have someone help you with your household activities and your newborn for at least a few days after you leave the hospital.  Rest as much as possible. Try to rest or take a nap when your newborn is sleeping.  Increase your activities gradually.  Keep all of your scheduled postpartum appointments. It is very important to keep your scheduled follow-up appointments. At these appointments, your health care provider will be checking to make sure that you are healing physically and emotionally. SEEK MEDICAL CARE IF:   You are passing large clots from your vagina. Save any clots to show your health care provider.  You have a foul smelling discharge from your vagina.  You have trouble urinating.  You are urinating frequently.  You have pain when you urinate.  You have a change in your bowel movements.  You have increasing redness, pain, or swelling near your vaginal incision (episiotomy) or vaginal tear.  You have pus draining from your episiotomy or vaginal tear.  Your episiotomy or vaginal tear is separating.  You have painful, hard, or reddened breasts.  You have a severe headache.  You have blurred vision or see spots.  You feel sad or depressed.  You have thoughts of hurting yourself or your newborn.  You have questions about your care,  the care of your newborn, or medicines.  You are dizzy or light-headed.  You have a rash.  You have nausea or vomiting.  You were breastfeeding and have not had a menstrual period within 12 weeks after you stopped breastfeeding.  You are not breastfeeding and have not had a menstrual period by the 12th week after delivery.  You have a fever. SEEK IMMEDIATE MEDICAL CARE IF:   You have persistent pain.  You have chest pain.  You have shortness of breath.  You faint.  You have leg pain.  You have stomach pain.  Your vaginal  bleeding saturates two or more sanitary pads in 1 hour.   This information is not intended to replace advice given to you by your health care provider. Make sure you discuss any questions you have with your health care provider.   Document Released: 07/02/2000 Document Revised: 03/26/2015 Document Reviewed: 03/01/2012 Elsevier Interactive Patient Education [employer]

## 2015-09-22 ENCOUNTER — Ambulatory Visit: Payer: Self-pay | Admitting: Obstetrics & Gynecology

## 2016-12-31 ENCOUNTER — Ambulatory Visit (INDEPENDENT_AMBULATORY_CARE_PROVIDER_SITE_OTHER): Payer: Self-pay | Admitting: Internal Medicine

## 2016-12-31 ENCOUNTER — Encounter: Payer: Self-pay | Admitting: Internal Medicine

## 2016-12-31 VITALS — BP 110/68 | HR 66 | Resp 17 | Ht 61.0 in | Wt 146.0 lb

## 2016-12-31 DIAGNOSIS — Z79899 Other long term (current) drug therapy: Secondary | ICD-10-CM

## 2016-12-31 DIAGNOSIS — M542 Cervicalgia: Secondary | ICD-10-CM

## 2016-12-31 DIAGNOSIS — B351 Tinea unguium: Secondary | ICD-10-CM

## 2016-12-31 DIAGNOSIS — B353 Tinea pedis: Secondary | ICD-10-CM

## 2016-12-31 MED ORDER — TERBINAFINE HCL 250 MG PO TABS: ORAL_TABLET | ORAL | 0 refills | Status: DC

## 2016-12-31 NOTE — Progress Notes (Signed)
   Subjective:    Patient ID: [name] Reyes, female    DOB: Mar 26, 1991, 26 y.o.   MRN: [id number]  HPI   Here to establish  [name] Reyes's sister.  1.  Thickening and yellow discoloration of right great toenail and little toenail.  Has had this for 3 years.   [facility] [place], so protected against pregnancy.  Not nursing.    2.  Neck Pain:  Pain over C7 vertebral spinous process.  She feels this is getting bigger.  Has had pain and heaviness there for 8 years.  No radiation of pain.   States sometimes has some numbness overlying right deltoid--this is rare.  She is not aware of what she is doing when occurs.  Can last up to 2 minutes.  Has not tried anything for the pain.  No outpatient prescriptions have been marked as taking for the 12/31/16 encounter (Office Visit) with Julieanne MansonMulberry, Japhet Morgenthaler, MD.    No Known Allergies   Past Medical History:  Diagnosis Date  . Medical history non-contributory   . Vaginal Pap smear, abnormal    colpo 2010    History reviewed. No pertinent surgical history.   Family History  Problem Relation Age of Onset  . Hyperlipidemia Mother   . Heart disease Father   . Hypertension Father   . Diabetes Father   . Hyperlipidemia Father   . Vision loss Paternal Grandmother    Social History   Social History  . Marital status: Married    Spouse name: Avel SensorRoberto Reyes  . Number of children: 4  . Years of education: 8   Occupational History  . Housewife    Social History Main Topics  . Smoking status: Never Smoker  . Smokeless tobacco: Never Used  . Alcohol use Yes     Comment: Socially  . Drug use: No  . Sexual activity: Yes    Partners: Male    Birth control/ protection: Injection     Comment: pregnant   Other Topics Concern  . Not on file   Social History Narrative   [place] and 4 children.        Review of Systems     Objective:   Physical Exam   NAD HEENT:  PERRL, EOMI, throat without injection, TMs pearly gray Neck:  Supple, No adenopathy Prominence over C7 spinous process--mildly tender.  Difficult to ascertain how much is bony vs. Soft tissue overlying the area. Lungs:  CTA CV:  RRR with normal S1 and S2, No S3, S4 or murmur.  Radial and DP pulses normal and equal Feet:  Both feet with peeling over plantar surface Right toenail with thickening and flaking beneath.  Little right toenail as well Neuro:  A & O x 3, CN II-XII grossly intact, DTRs unable to obtain throughout.  Motor 5/5, sensory normal to light touch.       Assessment & Plan:  1.  Toenail onychomycosis and Tinea pedis:  CMP for baseline.   Terbinafine 250 mg daily for 90 days. Follow up with me and Hepatic profile in 6 and 12 weeks. Instructions for shoe and shower floor care given.  2.  Prominence with pain over C7 spinous process:  Difficult to tell how much is soft tissue vs. Bone:  Send for C spine films as a start in evaluation.  No neurologic abnormality noted.

## 2016-12-31 NOTE — Patient Instructions (Signed)
Spray inside of all shoes and allow to dry when first start Terbinafine Spray inside of shoes after each use and allow to dry before wearing again Clean shower stall twice weekly with cleaner containing bleach Return in 6 weeks for recheck of blood Return in 12 weeks to see Dr. Malacki Mcphearson  

## 2016-12-31 NOTE — Progress Notes (Signed)
At this time client does not wish to have any services.

## 2017-01-01 LAB — COMPREHENSIVE METABOLIC PANEL
ALBUMIN: 4.3 g/dL (ref 3.5–5.5)
ALT: 11 IU/L (ref 0–32)
AST: 8 IU/L (ref 0–40)
Albumin/Globulin Ratio: 1.6 (ref 1.2–2.2)
Alkaline Phosphatase: 61 IU/L (ref 39–117)
BILIRUBIN TOTAL: 0.4 mg/dL (ref 0.0–1.2)
BUN/Creatinine Ratio: 19 (ref 9–23)
BUN: 12 mg/dL (ref 6–20)
CALCIUM: 9.3 mg/dL (ref 8.7–10.2)
CHLORIDE: 105 mmol/L (ref 96–106)
CO2: 21 mmol/L (ref 20–29)
CREATININE: 0.64 mg/dL (ref 0.57–1.00)
GFR, EST AFRICAN AMERICAN: 142 mL/min/{1.73_m2} (ref >59)
GFR, EST NON AFRICAN AMERICAN: 124 mL/min/{1.73_m2} (ref >59)
GLUCOSE: 86 mg/dL (ref 65–99)
Globulin, Total: 2.7 g/dL (ref 1.5–4.5)
Potassium: 4.2 mmol/L (ref 3.5–5.2)
Sodium: 141 mmol/L (ref 134–144)
TOTAL PROTEIN: 7 g/dL (ref 6.0–8.5)

## 2017-02-11 ENCOUNTER — Ambulatory Visit: Payer: Self-pay | Admitting: Internal Medicine

## 2017-03-02 ENCOUNTER — Ambulatory Visit: Payer: Self-pay | Admitting: Internal Medicine

## 2017-03-25 ENCOUNTER — Ambulatory Visit: Payer: Self-pay | Admitting: Internal Medicine

## 2018-11-27 ENCOUNTER — Other Ambulatory Visit: Payer: Self-pay

## 2018-11-27 ENCOUNTER — Telehealth (INDEPENDENT_AMBULATORY_CARE_PROVIDER_SITE_OTHER): Payer: Self-pay | Admitting: Internal Medicine

## 2018-11-27 DIAGNOSIS — J3089 Other allergic rhinitis: Secondary | ICD-10-CM

## 2018-11-27 MED ORDER — FLUTICASONE PROPIONATE 50 MCG/ACT NA SUSP: NASAL | 6 refills | Status: DC

## 2018-11-27 MED ORDER — LEVOCETIRIZINE DIHYDROCHLORIDE 5 MG PO TABS: 5.0000 mg | ORAL_TABLET | Freq: Every evening | ORAL | 6 refills | Status: DC

## 2018-11-27 MED ORDER — OLOPATADINE HCL 0.2 % OP SOLN: OPHTHALMIC | 6 refills | Status: DC

## 2018-11-27 NOTE — Progress Notes (Signed)
    Subjective:    Patient ID: [name] Reyes, female   DOB: 07/26/1990, 28 y.o.   MRN: [id number]   HPI   Not seen since mid 2018  Allergies:  Always gets allergies this time of year and also when around lots of dust.  2 weeks ago, started coughing really bad.  Cough is mainly at night. Throat is very itchy. + watery, burning, itchy eyes  + runny nose, clear with sneezing. No fever. No headache.   No chest tightness--but the first 2 days did have tightness. She is not sure, but feels her cough maybe is coming from her chest.  No one else in household is ill. Has a husband and 4 children.     No carpeting. No pets generally, but was dog sitting for about 4 days when the cough started.   Was a puppy, so had the dog inside.  She feels she has cleaned well since the pup was there. She does clean sheets/bedclothes regularly.   Current Meds  Medication Sig  . cetirizine (ZYRTEC) 10 MG tablet Take 10 mg by mouth daily.  Marland Kitchen levonorgestrel-ethinyl estradiol (NORDETTE) 0.15-30 MG-MCG tablet Take 1 tablet by mouth daily.  . naphazoline-pheniramine (NAPHCON-A) 0.025-0.3 % ophthalmic solution 1 drop 4 (four) times daily as needed for eye irritation.  . TRIAMCINOLONE ACETONIDE,NASAL, NA Place 1 spray into the nose daily.    No Known Allergies   Review of Systems    Objective:   There were no vitals taken for this visit.  Physical Exam  NAD Conjunctivae not injected.  No tearing of eyes noted.  PERRL, EOMI Sneezes intermittently   Assessment & Plan   Seasonal allergies and possible environmental allergies as well: Finish Triamcinolone nasal spray, but use 2 sprays each nostril daily. Prescription for Fluticasone nasal spray when done with current spray:  2 sprays each nostril daily. Switch to Levocetirizine 5 mg daily. Pataday eye drops 1 drop OU daily after complete with OTC Naphcon A Call if worsens, develops fever, or no improvement with changes .

## 2019-07-20 NOTE — L&D Delivery Note (Addendum)
OB/GYN Faculty Practice Delivery Note  [name] Reyes is a 29 y.o. [id number] s/p VD at [redacted]w[redacted]d She was admitted for IOL 2/2 prior IUFD at 325 wks   ROM: 2h 041mith clear fluid GBS Status: Negative Maximum Maternal Temperature: 98.6  Labor Progress: Pitocin was started at 0946.  Labor progressed and she was AROMed at 1423.  Progressed to complete.  Delivery Date/Time: 12/18/2019 at 1624 Delivery: Called to room and patient was complete and pushing. Head delivered LOA. No nuchal cord present. Shoulder and body delivered in usual fashion. Infant with spontaneous cry, placed on mother's abdomen, dried and stimulated. Cord clamped x 2 after 1-minute delay, and cut by FOB under my direct supervision. Cord blood drawn. Placenta delivered spontaneously with gentle cord traction. Fundus firm with massage and Pitocin. Labia, perineum, vagina, and cervix were inspected and found to be intact.   Placenta: Intact, 3 vessel cord Complications: None Lacerations: None EBL: 150 cc Analgesia: None  Postpartum Planning '[x]'$  message to sent to schedule follow-up  '[x]'$  MMR as been ordered  Infant: Viable female infant  APGARs 8/9  pending   EmIan BushmanMD PGY-2 Resident, Family Practice  I was present and gloved with the resident for the entire delivery. I agree with the above note.   HeMarcille BuffyNP, CNM  12/18/19  5:04 PM

## 2019-08-01 ENCOUNTER — Other Ambulatory Visit: Payer: Self-pay

## 2019-08-01 ENCOUNTER — Ambulatory Visit (INDEPENDENT_AMBULATORY_CARE_PROVIDER_SITE_OTHER): Payer: Self-pay | Admitting: *Deleted

## 2019-08-01 VITALS — Ht 62.0 in

## 2019-08-01 DIAGNOSIS — O09299 Supervision of pregnancy with other poor reproductive or obstetric history, unspecified trimester: Secondary | ICD-10-CM

## 2019-08-01 DIAGNOSIS — O0990 Supervision of high risk pregnancy, unspecified, unspecified trimester: Secondary | ICD-10-CM

## 2019-08-01 DIAGNOSIS — O09292 Supervision of pregnancy with other poor reproductive or obstetric history, second trimester: Secondary | ICD-10-CM

## 2019-08-01 NOTE — Progress Notes (Signed)
I connected with  [name] Reyes on 08/01/19 at 10:30 AM EST by telephone and verified that I am speaking with the correct person using two identifiers.   I discussed the limitations, risks, security and privacy concerns of performing an evaluation and management service by telephone and the availability of in person appointments. I also discussed with the patient that there may be a patient responsible charge related to this service. The patient expressed understanding and agreed to proceed. Explained I am completing her New OB Intake today. We discussed Her EDD and that it is based on  sure LMP. She was referred from [facility] and we have not received pregnancy test results- I will call them later today . I reviewed her allergies, meds, OB History, Medical /Surgical history, and appropriate screenings. I explained I will send her the Bab[employer]s app- app sent to her while on phone.  I explained we will igive her a blood pressure cuff at her new ob visit tomorrow and  we will  show her how to use it. Explained  then we will have her take her blood pressure weekly and enter into the app. Explained she will have some visits in office and some virtually. I sent her a MyChart text and she signed up and downloaded the  app. I reviewed her new ob  appointment date/ time with her , our location and to wear mask, no visitors.  I explained she will have a pelvic exam, ob bloodwork, hemoglobin a1C, cbg , genetic testing if desired,- she does want a panorama,  pap if needed- she states she is sure she had a pap in 2020 or 2019 at [facility] and will sign a release so we can get those records tomorrow.  I scheduled an [place] for first avaialable at [facility] since she is [facility] for 08/13/19 at 10:00  and gave her the appointment. She voices understanding.   Ivelise Castillo,RN 08/01/2019  10:37 AM

## 2019-08-01 NOTE — Patient Instructions (Signed)

## 2019-08-01 NOTE — Progress Notes (Signed)
Addendum:   4:46pm. I spoke with [facility] and she did refax Marquasha's pregnancy test and adopt a mom forms again- 4 pages. I informed her I have not received . She will refax Friday as she is not in the office again until Friday due to coronavirus .  Angelis Gates,RN

## 2019-08-02 ENCOUNTER — Ambulatory Visit (INDEPENDENT_AMBULATORY_CARE_PROVIDER_SITE_OTHER): Payer: Self-pay | Admitting: Obstetrics and Gynecology

## 2019-08-02 VITALS — BP 129/89 | HR 89 | Wt 160.6 lb

## 2019-08-02 DIAGNOSIS — Z3A12 12 weeks gestation of pregnancy: Secondary | ICD-10-CM

## 2019-08-02 DIAGNOSIS — O0992 Supervision of high risk pregnancy, unspecified, second trimester: Secondary | ICD-10-CM

## 2019-08-02 DIAGNOSIS — O0990 Supervision of high risk pregnancy, unspecified, unspecified trimester: Secondary | ICD-10-CM

## 2019-08-02 NOTE — Progress Notes (Signed)
History:   [name] Reyes is a 29 y.o. [phone] at [redacted]w[redacted]d by LMP being seen today for her first obstetrical visit.  Her obstetrical history is significant for allergies, fetal demise. Patient Unsure  intend to breast feed. Pregnancy history fully reviewed.  Patient reports no complaints.  HISTORY: OB History  Gravida Para Term Preterm AB Living  6 5 4 1  0 4  SAB TAB Ectopic Multiple Live Births  0 0 0 0 5    # Outcome Date GA Lbr Len/2nd Weight Sex Delivery Anes PTL Lv  6 Current           5 Term 08/15/15 [redacted]w[redacted]d 02:44 / 00:12 6 lb 8.2 oz (2.955 kg) M Vag-Spont None  LIV     Birth Comments: WNL     Name: [name] Reyes     Apgar1: 9  Apgar5: 9  4 Term 08/29/14 [redacted]w[redacted]d  7 lb 8.5 oz (3.415 kg) M Vag-Spont EPI  LIV     Birth Comments: wnl     Apgar1: 8  Apgar5: 9  3 Term 07/19/10 [redacted]w[redacted]d  7 lb 4 oz (3.289 kg) F Vag-Spont   LIV     Birth Comments: wnl  2 Term 05/13/09 [redacted]w[redacted]d  5 lb 6 oz (2.438 kg) F Vag-Spont EPI  LIV     Birth Comments: wnl  1 Preterm 04/30/05 [redacted]w[redacted]d  3 lb (1.361 kg) M Vag-Spont   ND     Birth Comments: PTL     Last pap smear was done 2019 and was normal  Past Medical History:  Diagnosis Date  . Medical history non-contributory   . Vaginal Pap smear, abnormal    colpo 2010   No past surgical history on file. Family History  Problem Relation Age of Onset  . Hyperlipidemia Mother   . Heart disease Father   . Hypertension Father   . Diabetes Father   . Hyperlipidemia Father   . Vision loss Paternal Grandmother    Social History   Tobacco Use  . Smoking status: Never Smoker  . Smokeless tobacco: Never Used  Substance Use Topics  . Alcohol use: Not Currently    Comment: Socially  . Drug use: No   No Known Allergies Current Outpatient Medications on File Prior to Visit  Medication Sig Dispense Refill  . Prenatal Vit-Fe Fumarate-FA (PRENATAL VITAMINS PO) Take 1 tablet by mouth daily.    . cetirizine (ZYRTEC) 10 MG tablet Take 10 mg by mouth daily.     . fluticasone (FLONASE) 50 MCG/ACT nasal spray 2 sprays each nostril daily (Patient not taking: Reported on 08/02/2019) 16 g 6  . levocetirizine (XYZAL) 5 MG tablet Take 1 tablet (5 mg total) by mouth every evening. (Patient not taking: Reported on 08/01/2019) 30 tablet 6  . levonorgestrel-ethinyl estradiol (NORDETTE) 0.15-30 MG-MCG tablet Take 1 tablet by mouth daily.    . naphazoline-pheniramine (NAPHCON-A) 0.025-0.3 % ophthalmic solution 1 drop 4 (four) times daily as needed for eye irritation.    . Olopatadine HCl (PATADAY) 0.2 % SOLN 1 drop both eyes once daily (Patient not taking: Reported on 08/02/2019) 2.5 mL 6  . TRIAMCINOLONE ACETONIDE,NASAL, NA Place 1 spray into the nose daily.     No current facility-administered medications on file prior to visit.    Review of Systems Pertinent items noted in HPI and remainder of comprehensive ROS otherwise negative. Physical Exam:   Vitals:   08/02/19 1010  BP: 129/89  Pulse: 89  Weight: 160 lb  9.6 oz (72.8 kg)   Fetal Heart Rate (bpm): 149 Uterus:      Skin: normal coloration and turgor, no rashes   Neurologic: oriented, normal, negative, normal mood   Extremities: normal strength, tone, and muscle mass, ROM of all joints is normal   HEENT PERRLA, extraocular movement intact and sclera clear, anicteric   Mouth/Teeth mucous membranes moist, pharynx normal without lesions and dental hygiene good   Neck supple and no masses   Cardiovascular: regular rate and rhythm   Respiratory:  no respiratory distress, normal breath sounds   Abdomen: soft, non-tender; bowel sounds normal; no masses,  no organomegaly     Assessment:    Pregnancy: [id number] Patient Active Problem List   Diagnosis Date Noted  . Supervision of high risk pregnancy, antepartum 08/01/2019  . Environmental and seasonal allergies 11/27/2018  . Prior pregnancy with fetal demise at 24 weeks, antepartum 06/20/2014  . Prior pregnancy complicated by SGA (small for  gestational age), antepartum 06/20/2014     Plan:  1. Supervision of high risk pregnancy, antepartum  - Hemoglobin A1c - Obstetric Panel, Including HIV - Culture, OB Urine - Genetic Screening - US scheduled 08/13/19 at the [facility]   Initial labs drawn. Continue prenatal vitamins. Genetic Screening discussed, NIPS: ordered. Ultrasound discussed; fetal anatomic survey: ordered. Problem list reviewed and updated. The nature of [place] with multiple MDs and other Advanced Practice Providers was explained to patient; also emphasized that residents, students are part of our team. Routine obstetric precautions reviewed. No follow-ups on file.[email or web address]    Leyah Bocchino, Artist Pais, [place] for [employer], [place]

## 2019-08-03 ENCOUNTER — Encounter: Payer: Self-pay | Admitting: *Deleted

## 2019-08-03 LAB — OBSTETRIC PANEL, INCLUDING HIV
Antibody Screen: NEGATIVE
Basophils Absolute: 0 10*3/uL (ref 0.0–0.2)
Basos: 0 %
EOS (ABSOLUTE): 0.1 10*3/uL (ref 0.0–0.4)
Eos: 1 %
HIV Screen 4th Generation wRfx: NONREACTIVE
Hematocrit: 34.2 % (ref 34.0–46.6)
Hemoglobin: 11.5 g/dL (ref 11.1–15.9)
Hepatitis B Surface Ag: NEGATIVE
Immature Grans (Abs): 0 10*3/uL (ref 0.0–0.1)
Immature Granulocytes: 0 %
Lymphocytes Absolute: 2.3 10*3/uL (ref 0.7–3.1)
Lymphs: 28 %
MCH: 29.2 pg (ref 26.6–33.0)
MCHC: 33.6 g/dL (ref 31.5–35.7)
MCV: 87 fL (ref 79–97)
Monocytes Absolute: 0.5 10*3/uL (ref 0.1–0.9)
Monocytes: 6 %
Neutrophils Absolute: 5.3 10*3/uL (ref 1.4–7.0)
Neutrophils: 65 %
Platelets: 265 10*3/uL (ref 150–450)
RBC: 3.94 x10E6/uL (ref 3.77–5.28)
RDW: 13.9 % (ref 11.7–15.4)
RPR Ser Ql: NONREACTIVE
Rh Factor: POSITIVE
Rubella Antibodies, IGG: <0.9 index — ABNORMAL LOW (ref >0.99)
WBC: 8.1 10*3/uL (ref 3.4–10.8)

## 2019-08-03 LAB — HEMOGLOBIN A1C
Est. average glucose Bld gHb Est-mCnc: 97 mg/dL
Hgb A1c MFr Bld: 5 % (ref 4.8–5.6)

## 2019-08-04 LAB — CULTURE, OB URINE

## 2019-08-04 LAB — URINE CULTURE, OB REFLEX

## 2019-08-06 ENCOUNTER — Encounter: Payer: Self-pay | Admitting: *Deleted

## 2019-08-07 ENCOUNTER — Encounter: Payer: Self-pay | Admitting: Obstetrics and Gynecology

## 2019-08-07 DIAGNOSIS — Z2839 Other underimmunization status: Secondary | ICD-10-CM | POA: Insufficient documentation

## 2019-08-07 DIAGNOSIS — Z283 Underimmunization status: Secondary | ICD-10-CM | POA: Insufficient documentation

## 2019-08-13 ENCOUNTER — Encounter: Payer: Self-pay | Admitting: *Deleted

## 2019-08-21 ENCOUNTER — Encounter: Payer: Self-pay | Admitting: *Deleted

## 2019-08-30 ENCOUNTER — Other Ambulatory Visit: Payer: Self-pay

## 2019-08-30 ENCOUNTER — Ambulatory Visit (INDEPENDENT_AMBULATORY_CARE_PROVIDER_SITE_OTHER): Payer: Self-pay | Admitting: Obstetrics and Gynecology

## 2019-08-30 VITALS — BP 116/58 | HR 85 | Wt 162.0 lb

## 2019-08-30 DIAGNOSIS — O09292 Supervision of pregnancy with other poor reproductive or obstetric history, second trimester: Secondary | ICD-10-CM

## 2019-08-30 DIAGNOSIS — Z3A25 25 weeks gestation of pregnancy: Secondary | ICD-10-CM

## 2019-08-30 DIAGNOSIS — Z23 Encounter for immunization: Secondary | ICD-10-CM

## 2019-08-30 DIAGNOSIS — O0990 Supervision of high risk pregnancy, unspecified, unspecified trimester: Secondary | ICD-10-CM

## 2019-08-30 NOTE — Progress Notes (Deleted)
f/u l

## 2019-08-30 NOTE — Progress Notes (Signed)
   PRENATAL VISIT NOTE  Subjective:  [name] Reyes is a 29 y.o. [phone] at [redacted]w[redacted]d being seen today for ongoing prenatal care.  She is currently monitored for the following issues for this low-risk pregnancy and has Prior pregnancy with fetal demise at 34 weeks, antepartum; Prior pregnancy complicated by SGA (small for gestational age), antepartum; Environmental and seasonal allergies; Supervision of high risk pregnancy, antepartum; and Rubella non-immune status, antepartum on their problem list.  Patient reports no complaints.  Contractions: Not present. Vag. Bleeding: Bloody Show.  Movement: Present. Denies leaking of fluid.   The following portions of the patient's history were reviewed and updated as appropriate: allergies, current medications, past family history, past medical history, past social history, past surgical history and problem list.   Objective:   Vitals:   08/30/19 1114  BP: (!) 116/58  Pulse: 85  Weight: 162 lb (73.5 kg)    Fetal Status: Fetal Heart Rate (bpm): 142 Fundal Height: 25 cm Movement: Present     General:  Alert, oriented and cooperative. Patient is in no acute distress.  Skin: Skin is warm and dry. No rash noted.   Cardiovascular: Normal heart rate noted  Respiratory: Normal respiratory effort, no problems with respiration noted  Abdomen: Soft, gravid, appropriate for gestational age.  Pain/Pressure: Present     Pelvic: Cervical exam deferred        Extremities: Normal range of motion.  Edema: None  Mental Status: Normal mood and affect. Normal behavior. Normal judgment and thought content.   Assessment and Plan:  Pregnancy: [id number] at [redacted]w[redacted]d 1. Supervision of high risk pregnancy, antepartum  Doing well, 2 hour GTT next visit.   2. Need for influenza vaccination  - Flu Vaccine QUAD 36+ mos IM   Preterm labor symptoms and general obstetric precautions including but not limited to vaginal bleeding, contractions, leaking of fluid and fetal  movement were reviewed in detail with the patient. Please refer to After Visit Summary for other counseling recommendations.   Return in about 4 weeks (around 09/27/2019) for 2 hour glucose needed .  Future Appointments  Date Time Provider Department Center  10/02/2019  8:15 AM Judeth Horn, NP [facility]  10/02/2019  8:50 AM [facility]    Venia Carbon, NP

## 2019-09-27 ENCOUNTER — Other Ambulatory Visit: Payer: Self-pay

## 2019-09-27 DIAGNOSIS — O0990 Supervision of high risk pregnancy, unspecified, unspecified trimester: Secondary | ICD-10-CM

## 2019-10-02 ENCOUNTER — Ambulatory Visit (INDEPENDENT_AMBULATORY_CARE_PROVIDER_SITE_OTHER): Payer: Self-pay | Admitting: Student

## 2019-10-02 ENCOUNTER — Encounter: Payer: Self-pay | Admitting: Student

## 2019-10-02 ENCOUNTER — Other Ambulatory Visit: Payer: Self-pay

## 2019-10-02 VITALS — BP 121/71 | HR 80 | Wt 165.3 lb

## 2019-10-02 DIAGNOSIS — O0993 Supervision of high risk pregnancy, unspecified, third trimester: Secondary | ICD-10-CM

## 2019-10-02 DIAGNOSIS — O0990 Supervision of high risk pregnancy, unspecified, unspecified trimester: Secondary | ICD-10-CM

## 2019-10-02 DIAGNOSIS — Z23 Encounter for immunization: Secondary | ICD-10-CM

## 2019-10-02 DIAGNOSIS — Z3A30 30 weeks gestation of pregnancy: Secondary | ICD-10-CM

## 2019-10-02 NOTE — Patient Instructions (Signed)
The Maternity Assessment Unit (MAU) is located at the [facility] at [facility]. The address is: [address], [address], [place], [place] [address]. Please see map below for additional directions.    The Maternity Assessment Unit is designed to help you during your pregnancy, and for up to 6 weeks after delivery, with any pregnancy- or postpartum-related emergencies, if you think you are in labor, or if your water has broken. For example, if you experience nausea and vomiting, vaginal bleeding, severe abdominal or pelvic pain, elevated blood pressure or other problems related to your pregnancy or postpartum time, please come to the Maternity Assessment Unit for assistance.  

## 2019-10-02 NOTE — Progress Notes (Signed)
   PRENATAL VISIT NOTE  Subjective:  [name] Reyes is a 29 y.o. [phone] at [redacted]w[redacted]d being seen today for ongoing prenatal care.  She is currently monitored for the following issues for this high-risk pregnancy and has Prior pregnancy with fetal demise at 34 weeks, antepartum; Prior pregnancy complicated by SGA (small for gestational age), antepartum; Environmental and seasonal allergies; Supervision of high risk pregnancy, antepartum; and Rubella non-immune status, antepartum on their problem list.  Patient reports no complaints.  Contractions: Not present. Vag. Bleeding: None.  Movement: Present. Denies leaking of fluid.   The following portions of the patient's history were reviewed and updated as appropriate: allergies, current medications, past family history, past medical history, past social history, past surgical history and problem list.   Objective:   Vitals:   10/02/19 0905  BP: 121/71  Pulse: 80  Weight: 165 lb 4.8 oz (75 kg)    Fetal Status: Fetal Heart Rate (bpm): 140 Fundal Height: 29 cm Movement: Present     General:  Alert, oriented and cooperative. Patient is in no acute distress.  Skin: Skin is warm and dry. No rash noted.   Cardiovascular: Normal heart rate noted  Respiratory: Normal respiratory effort, no problems with respiration noted  Abdomen: Soft, gravid, appropriate for gestational age.  Pain/Pressure: Present     Pelvic: Cervical exam deferred        Extremities: Normal range of motion.  Edema: None  Mental Status: Normal mood and affect. Normal behavior. Normal judgment and thought content.   Assessment and Plan:  Pregnancy: [id number] at [redacted]w[redacted]d 1. Supervision of high risk pregnancy, antepartum -doing well -reviewed ultrasound & labs. Fundal height appropriate today.  -given information regarding location of [facility]  2. Need for Tdap vaccination   Preterm labor symptoms and general obstetric precautions including but not limited to vaginal bleeding,  contractions, leaking of fluid and fetal movement were reviewed in detail with the patient. Please refer to After Visit Summary for other counseling recommendations.   Return in about 2 weeks (around 10/16/2019) for Routine OB, virtual.  No future appointments.  Judeth Horn, NP

## 2019-10-02 NOTE — Addendum Note (Signed)
Addended by: Kathee Delton on: 10/02/2019 09:36 AM   Modules accepted: Orders

## 2019-10-03 LAB — CBC
Hematocrit: 33.4 % — ABNORMAL LOW (ref 34.0–46.6)
Hemoglobin: 10.9 g/dL — ABNORMAL LOW (ref 11.1–15.9)
MCH: 28.7 pg (ref 26.6–33.0)
MCHC: 32.6 g/dL (ref 31.5–35.7)
MCV: 88 fL (ref 79–97)
Platelets: 246 10*3/uL (ref 150–450)
RBC: 3.8 x10E6/uL (ref 3.77–5.28)
RDW: 13.5 % (ref 11.7–15.4)
WBC: 7.8 10*3/uL (ref 3.4–10.8)

## 2019-10-03 LAB — GLUCOSE TOLERANCE, 2 HOURS W/ 1HR
Glucose, 1 hour: 120 mg/dL (ref 65–179)
Glucose, 2 hour: 73 mg/dL (ref 65–152)
Glucose, Fasting: 83 mg/dL (ref 65–91)

## 2019-10-03 LAB — HIV ANTIBODY (ROUTINE TESTING W REFLEX): HIV Screen 4th Generation wRfx: NONREACTIVE

## 2019-10-03 LAB — RPR: RPR Ser Ql: NONREACTIVE

## 2019-10-16 ENCOUNTER — Encounter: Payer: Self-pay | Admitting: Medical

## 2019-10-16 ENCOUNTER — Telehealth (INDEPENDENT_AMBULATORY_CARE_PROVIDER_SITE_OTHER): Payer: Self-pay | Admitting: Medical

## 2019-10-16 DIAGNOSIS — Z5329 Procedure and treatment not carried out because of patient's decision for other reasons: Secondary | ICD-10-CM

## 2019-10-16 NOTE — Patient Instructions (Signed)
Please reschedule your appointment as soon as possible 

## 2019-10-16 NOTE — Progress Notes (Signed)
1559: Called pt using Spanish Interpreter Donald Pore id# [id number] from [facility] to start My Chart visit, no answer,pt was not available at this time & to please call back later.   1630: 2nd attempt to reach pt using Spanish [employer] Interpreter Blossom Hoops id # [id number] answer, could not leave a message. Pt will need to be rescheduled.

## 2019-11-13 ENCOUNTER — Other Ambulatory Visit (HOSPITAL_COMMUNITY): Admission: RE | Admit: 2019-11-13 | Payer: Self-pay | Source: Ambulatory Visit

## 2019-11-13 ENCOUNTER — Other Ambulatory Visit: Payer: Self-pay

## 2019-11-13 ENCOUNTER — Ambulatory Visit (INDEPENDENT_AMBULATORY_CARE_PROVIDER_SITE_OTHER): Payer: Self-pay | Admitting: Nurse Practitioner

## 2019-11-13 ENCOUNTER — Other Ambulatory Visit (HOSPITAL_COMMUNITY)
Admission: RE | Admit: 2019-11-13 | Discharge: 2019-11-13 | Disposition: A | Discharge status: Home / Self Care | Payer: Self-pay | Source: Ambulatory Visit | Attending: Nurse Practitioner | Admitting: Nurse Practitioner

## 2019-11-13 VITALS — BP 122/66 | HR 84 | Wt 166.7 lb

## 2019-11-13 DIAGNOSIS — O0990 Supervision of high risk pregnancy, unspecified, unspecified trimester: Secondary | ICD-10-CM

## 2019-11-13 DIAGNOSIS — Z3A36 36 weeks gestation of pregnancy: Secondary | ICD-10-CM

## 2019-11-13 DIAGNOSIS — O26843 Uterine size-date discrepancy, third trimester: Secondary | ICD-10-CM

## 2019-11-13 DIAGNOSIS — O09292 Supervision of pregnancy with other poor reproductive or obstetric history, second trimester: Secondary | ICD-10-CM

## 2019-11-13 DIAGNOSIS — O09293 Supervision of pregnancy with other poor reproductive or obstetric history, third trimester: Secondary | ICD-10-CM

## 2019-11-13 NOTE — Progress Notes (Signed)
    Subjective:  [name] Reyes is a 29 y.o. [phone] at [redacted]w[redacted]d being seen today for ongoing prenatal care.  She is currently monitored for the following issues for this high-risk pregnancy and has Prior pregnancy with fetal demise at 34 weeks, antepartum; Prior pregnancy complicated by SGA (small for gestational age), antepartum; Environmental and seasonal allergies; Supervision of high risk pregnancy, antepartum; and Rubella non-immune status, antepartum on their problem list.  Patient reports no complaints.  Contractions: Irritability. Vag. Bleeding: None.  Movement: Present. Denies leaking of fluid.   The following portions of the patient's history were reviewed and updated as appropriate: allergies, current medications, past family history, past medical history, past social history, past surgical history and problem list. Problem list updated.  Objective:   Vitals:   11/13/19 1024  BP: 122/66  Pulse: 84  Weight: 166 lb 11.2 oz (75.6 kg)    Fetal Status: Fetal Heart Rate (bpm): 142 Fundal Height: 34 cm Movement: Present  Presentation: Vertex  General:  Alert, oriented and cooperative. Patient is in no acute distress.  Skin: Skin is warm and dry. No rash noted.   Cardiovascular: Normal heart rate noted  Respiratory: Normal respiratory effort, no problems with respiration noted  Abdomen: Soft, gravid, appropriate for gestational age. Pain/Pressure: Present     Pelvic:  Cervical exam deferred Dilation: Closed Effacement (%): Thick Station: Ballotable  Extremities: Normal range of motion.  Edema: None  Mental Status: Normal mood and affect. Normal behavior. Normal judgment and thought content.   Urinalysis:      Assessment and Plan:  Pregnancy: [id number] at [redacted]w[redacted]d  1. Supervision of high risk pregnancy, antepartum Previous IUFD but then 4 live births. Baby is moving well  - GC/Chlamydia probe amp ([place])not at [facility] - Culture, beta strep (group b only)  2. Prior pregnancy  with fetal demise and current pregnancy in second trimester Baby is moving well.  3. Uterine size date discrepancy pregnancy, third trimester Measuring small today.  Other ultrasounds have been done at [facility] - discussed with client.  Wants [place] at [facility] this time.  Preterm labor symptoms and general obstetric precautions including but not limited to vaginal bleeding, contractions, leaking of fluid and fetal movement were reviewed in detail with the patient. Please refer to After Visit Summary for other counseling recommendations.  Return in about 1 week (around 11/20/2019) for in person ROB.  Nolene Bernheim, RN, MSN, NP-BC Nurse Practitioner, [facility] for [employer], [facility] 11/13/2019 8:28 PM

## 2019-11-14 LAB — GC/CHLAMYDIA PROBE AMP (~~LOC~~) NOT AT ARMC
Chlamydia: NEGATIVE
Comment: NEGATIVE
Comment: NORMAL
Neisseria Gonorrhea: NEGATIVE

## 2019-11-19 NOTE — Progress Notes (Deleted)
Induction Assessment Scheduling Form: Fax to [facility]:  [phone] Route to [facility]-2[facility] Labor Delivery   Jeaneen Cala                                                                                   DOB:  1990/10/07                                                            MRN:  [id number]  Phone:  Home Phone [phone]  Mobile [phone]    Provider:  [facility]-{Blank single:19197::"[facility]","[facility]"} (Faculty Practice)  Admission Date/Time:  *** GP:  [id number]     Gestational age on admission:  ***                                                Estimated Date of Delivery: 12/10/19  Dating Criteria: {Blank single:19197::"LMP","US at *** weeks"}  There were no vitals filed for this visit.  GBS:   HIV:  Non Reactive (03/16 0859)  Medical Indications for induction:  *** Scheduling Provider Signature:  Marylene Land, CNM         Method of induction(proposed):  {Blank single:19197::"Cytotec","Pitocin","Foley - ***"}   Scheduling Provider Signature:  Marylene Land, CNM                                            Today's Date:  11/19/2019

## 2019-11-20 ENCOUNTER — Ambulatory Visit (INDEPENDENT_AMBULATORY_CARE_PROVIDER_SITE_OTHER): Payer: Self-pay | Admitting: Student

## 2019-11-20 ENCOUNTER — Other Ambulatory Visit (HOSPITAL_COMMUNITY)
Admission: RE | Admit: 2019-11-20 | Discharge: 2019-11-20 | Disposition: A | Discharge status: Home / Self Care | Payer: Self-pay | Source: Ambulatory Visit | Attending: Student | Admitting: Student

## 2019-11-20 ENCOUNTER — Other Ambulatory Visit: Payer: Self-pay

## 2019-11-20 DIAGNOSIS — Z3A37 37 weeks gestation of pregnancy: Secondary | ICD-10-CM

## 2019-11-20 DIAGNOSIS — O0990 Supervision of high risk pregnancy, unspecified, unspecified trimester: Secondary | ICD-10-CM | POA: Insufficient documentation

## 2019-11-20 DIAGNOSIS — O09293 Supervision of pregnancy with other poor reproductive or obstetric history, third trimester: Secondary | ICD-10-CM

## 2019-11-20 NOTE — Patient Instructions (Signed)
Balloon Catheter Placement for Cervical Ripening Balloon catheter placement for cervical ripening is a procedure to help your cervix start to soften (ripen) and open (dilate). It is done to prepare your body for labor induction. During this procedure, a thin tube (catheter) is placed through your cervix. A tiny balloon attached to the catheter is inflated with water. Pressure from the balloon is what helps your cervix start to open. Cervical ripening with a balloon catheter can make labor induction shorter and easier. You may have this procedure if:  Your cervix is not ready for labor.  Your health care provider has planned labor induction.  You are not having twins or multiples.  Your baby is in the head-down position.  You do not have any other pregnancy complications that require you to be monitored in the hospital after balloon catheter placement. If your health care provider has recommended labor induction to stimulate a vaginal birth, this procedure may be started the day before induction. You will go home with the balloon in place and return to start induction in 12-24 hours. You may have this procedure and stay in the hospital so that your progress can be monitored as well. Tell a health care provider about:  Any allergies you have.  All medicines you are taking, including vitamins, herbs, eye drops, creams, and over-the-counter medicines.  Any blood disorders you have.  Any surgeries you have had.  Any medical conditions you have. What are the risks? Generally, this is a safe procedure. However, problems may occur, including:  Infection.  Bleeding.  Cramping or pain.  Difficulty passing urine.  The baby moving from the head-down position to a position with the feet or buttocks down (breech position). What happens before the procedure?  Your health care provider may check your baby's heartbeat (fetal monitoring) before the procedure.  You may be asked to empty your  bladder. What happens during the procedure?   You will be positioned on the exam table as if you were having a pelvic exam or Pap test.  Your health care provider may insert a medical instrument into your vagina (speculum) to see your cervix.  Your cervix may be cleaned with a germ-killing solution.  The catheter will be inserted through the opening of your cervix.  A balloon on the end of the catheter will be inflated with sterile water. Some catheters have two balloons, one on each side of the cervix.  Depending on the type of balloon catheter, the end of the catheter may be left free outside your cervix or taped to your leg. The procedure may vary among health care providers and hospitals. What can I expect after the procedure?  After the procedure, it is common to have: ? A feeling of pressure inside your vagina. ? Light vaginal bleeding (spotting).  You may have fetal monitoring before going home.  You may be sent home with the catheter in place and asked to return to start your induction in about 12-24 hours. Follow these instructions at home:  Take over-the-counter and prescription medicines only as told by your health care provider.  Return to your normal activities at home as told by your health care provider. Ask your health care provider what activities are safe for you. Do not leave home until you return for labor induction.  You may shower at home. Do not take baths, swim, or use a hot tub unless your health care provider approves.  As your cervix opens, your catheter and balloon may   fall out before you return for labor induction. Ask your health care provider what you should do if this happens.  Keep all follow-up visits as told by your health care provider. This is important. You will need to return to start induction as told by your health care provider. Contact your health care provider if:  You have chills or a fever.  You have constant pain or cramps (not  contractions).  You have trouble passing urine.  Your water breaks.  You have vaginal bleeding that is heavier than spotting.  You have contractions that start to last longer and come closer together (about every 5 minutes).  The balloon catheter falls out before you return to start your induction. Summary  Cervical ripening with placement of a balloon catheter is an outpatient procedure to prepare you for labor induction.  Cervical ripening with a balloon catheter helps your cervix start to open for birth.  You will be positioned on the exam table. The catheter will be inserted through the opening of your cervix. A balloon on the end of the catheter will be inflated with water.  Pressure from the balloon will cause ripening of your cervix. You will go home with the balloon in place and return to start induction in 12-24 hours.  Contact your health care provider if you have pain, fever, vaginal bleeding, or trouble passing urine. Also contact him or her if your water breaks, you start to go into labor, or your balloon catheter falls out before you return to start your induction. This information is not intended to replace advice given to you by your health care provider. Make sure you discuss any questions you have with your health care provider. Document Revised: 03/03/2018 Document Reviewed: 03/03/2018 Elsevier Patient Education  2020 Elsevier Inc.  

## 2019-11-20 NOTE — Progress Notes (Signed)
Patient ID: [name] Reyes, female   DOB: Mar 28, 1991, 29 y.o.   MRN: [id number]   PRENATAL VISIT NOTE  Subjective:  [name] Reyes is a 29 y.o. [id number] at [redacted]w[redacted]d being seen today for ongoing prenatal care.  She is currently monitored for the following issues for this high-risk pregnancy and has Prior pregnancy with fetal demise at 34 weeks, antepartum; Prior pregnancy complicated by SGA (small for gestational age), antepartum; Environmental and seasonal allergies; Supervision of high risk pregnancy, antepartum; and Rubella non-immune status, antepartum on their problem list.  Patient reports no complaints. She is interested in the nexplanon.  Contractions: Irritability. Vag. Bleeding: None.  Movement: Present. Denies leaking of fluid.   The following portions of the patient's history were reviewed and updated as appropriate: allergies, current medications, past family history, past medical history, past social history, past surgical history and problem list.   Objective:   Vitals:   11/20/19 1556  BP: 116/80  Pulse: 88  Weight: 166 lb (75.3 kg)    Fetal Status: Fetal Heart Rate (bpm): 131 Fundal Height: 34 cm Movement: Present     General:  Alert, oriented and cooperative. Patient is in no acute distress.  Skin: Skin is warm and dry. No rash noted.   Cardiovascular: Normal heart rate noted  Respiratory: Normal respiratory effort, no problems with respiration noted  Abdomen: Soft, gravid, appropriate for gestational age.  Pain/Pressure: Present     Pelvic: Cervical exam deferred        Extremities: Normal range of motion.  Edema: None  Mental Status: Normal mood and affect. Normal behavior. Normal judgment and thought content.   Assessment and Plan:  Pregnancy: [id number] at [redacted]w[redacted]d 1. Prior pregnancy with fetal demise and current pregnancy in third trimester -has not had antenatal testing; will schedule BPP/NST this week, plus growth scan as patient is measuring small today. Patient  needs BPP/NST next week as well.  -Plan for IOL at 39 weeks; patient is agreeable. Patient is also agreeable to FB placement. Will send IOL form to L and D for IOL.  Term labor symptoms and general obstetric precautions including but not limited to vaginal bleeding, contractions, leaking of fluid and fetal movement were reviewed in detail with the patient. Please refer to After Visit Summary for other counseling recommendations.   No follow-ups on file.  Future Appointments  Date Time Provider Department Center  11/22/2019  9:15 AM [facility] NST [facility]  11/22/2019 10:45 AM [facility] US2 [facility]  11/27/2019  3:15 PM Currie Paris, NP [facility]  12/03/2019 11:00 AM [facility] NURSE [facility]  12/04/2019  3:35 PM Marylene Land, CNM [facility]    Marylene Land, CNM

## 2019-11-21 ENCOUNTER — Other Ambulatory Visit: Payer: Self-pay

## 2019-11-21 ENCOUNTER — Other Ambulatory Visit: Payer: Self-pay | Admitting: Student

## 2019-11-21 DIAGNOSIS — O09293 Supervision of pregnancy with other poor reproductive or obstetric history, third trimester: Secondary | ICD-10-CM

## 2019-11-21 NOTE — Progress Notes (Signed)
Patient ID: [name] Reyes, female   DOB: [birth year], 29 y.o.   MRN: [id number] Induction Assessment Scheduling Form: Fax to [facility]:  [phone] Route to [facility]-2[facility] Labor Delivery   Melyssa Signor                                                                                   DOB:  1991-06-23                                                            MRN:  [id number]  Phone:  Home Phone [phone]  Mobile [phone]    Provider:  [facility] ([facility])  Admission Date/Time:  5-17, 5-18, 5-19 GP:  [id number]     Gestational age on admission:  39                                        Estimated Date of Delivery: 12/10/19  Dating Criteria: LMP  There were no vitals filed for this visit.  GBS:  unknown-collected but not resulted yet HIV:  Non Reactive (03/16 0859)  Medical Indications for induction:  History of IUFD at 34 weeks.  Scheduling Provider Signature:  Marylene Land, CNM         Method of induction(proposed):  Pitocin after foley bulb insertion 12 hours earlier   Scheduling Provider Signature:  Marylene Land, [place]                                            Today's Date:  11/21/2019

## 2019-11-22 ENCOUNTER — Telehealth (HOSPITAL_COMMUNITY): Payer: Self-pay | Admitting: *Deleted

## 2019-11-22 ENCOUNTER — Ambulatory Visit: Payer: Self-pay | Admitting: *Deleted

## 2019-11-22 ENCOUNTER — Ambulatory Visit: Payer: Self-pay | Attending: Nurse Practitioner

## 2019-11-22 ENCOUNTER — Other Ambulatory Visit: Payer: Self-pay

## 2019-11-22 ENCOUNTER — Encounter (HOSPITAL_COMMUNITY): Payer: Self-pay | Admitting: *Deleted

## 2019-11-22 ENCOUNTER — Ambulatory Visit (INDEPENDENT_AMBULATORY_CARE_PROVIDER_SITE_OTHER): Payer: Self-pay | Admitting: *Deleted

## 2019-11-22 ENCOUNTER — Other Ambulatory Visit: Payer: Self-pay | Admitting: Student

## 2019-11-22 VITALS — BP 123/79 | HR 100 | Wt 166.0 lb

## 2019-11-22 DIAGNOSIS — Z363 Encounter for antenatal screening for malformations: Secondary | ICD-10-CM

## 2019-11-22 DIAGNOSIS — Z3A35 35 weeks gestation of pregnancy: Secondary | ICD-10-CM

## 2019-11-22 DIAGNOSIS — O09293 Supervision of pregnancy with other poor reproductive or obstetric history, third trimester: Secondary | ICD-10-CM

## 2019-11-22 DIAGNOSIS — O09292 Supervision of pregnancy with other poor reproductive or obstetric history, second trimester: Secondary | ICD-10-CM | POA: Insufficient documentation

## 2019-11-22 DIAGNOSIS — O99891 Other specified diseases and conditions complicating pregnancy: Secondary | ICD-10-CM | POA: Insufficient documentation

## 2019-11-22 DIAGNOSIS — Z283 Underimmunization status: Secondary | ICD-10-CM | POA: Insufficient documentation

## 2019-11-22 DIAGNOSIS — O0990 Supervision of high risk pregnancy, unspecified, unspecified trimester: Secondary | ICD-10-CM

## 2019-11-22 DIAGNOSIS — O0933 Supervision of pregnancy with insufficient antenatal care, third trimester: Secondary | ICD-10-CM

## 2019-11-22 DIAGNOSIS — O26843 Uterine size-date discrepancy, third trimester: Secondary | ICD-10-CM

## 2019-11-22 DIAGNOSIS — Z2839 Other underimmunization status: Secondary | ICD-10-CM

## 2019-11-22 DIAGNOSIS — O09213 Supervision of pregnancy with history of pre-term labor, third trimester: Secondary | ICD-10-CM

## 2019-11-22 LAB — GC/CHLAMYDIA PROBE AMP (~~LOC~~) NOT AT ARMC
Chlamydia: NEGATIVE
Comment: NEGATIVE
Comment: NORMAL
Neisseria Gonorrhea: NEGATIVE

## 2019-11-22 NOTE — Telephone Encounter (Signed)
Preadmission screen  

## 2019-11-22 NOTE — Progress Notes (Unsigned)
[id number]  

## 2019-11-22 NOTE — Progress Notes (Signed)
Pt has [place] for growth @ M[facility] today, BPP added

## 2019-11-24 LAB — CULTURE, BETA STREP (GROUP B ONLY): Strep Gp B Culture: NEGATIVE

## 2019-11-26 ENCOUNTER — Other Ambulatory Visit: Payer: Self-pay | Admitting: Family Medicine

## 2019-11-26 DIAGNOSIS — O09293 Supervision of pregnancy with other poor reproductive or obstetric history, third trimester: Secondary | ICD-10-CM

## 2019-11-27 ENCOUNTER — Encounter: Payer: Self-pay | Admitting: Nurse Practitioner

## 2019-11-29 ENCOUNTER — Other Ambulatory Visit: Payer: Self-pay

## 2019-11-29 ENCOUNTER — Ambulatory Visit: Payer: Self-pay

## 2019-11-29 ENCOUNTER — Ambulatory Visit (INDEPENDENT_AMBULATORY_CARE_PROVIDER_SITE_OTHER): Payer: Self-pay | Admitting: *Deleted

## 2019-11-29 ENCOUNTER — Ambulatory Visit (INDEPENDENT_AMBULATORY_CARE_PROVIDER_SITE_OTHER): Payer: Self-pay | Admitting: Obstetrics & Gynecology

## 2019-11-29 VITALS — BP 119/71 | HR 97 | Wt 167.1 lb

## 2019-11-29 DIAGNOSIS — Z3A36 36 weeks gestation of pregnancy: Secondary | ICD-10-CM

## 2019-11-29 DIAGNOSIS — Z283 Underimmunization status: Secondary | ICD-10-CM

## 2019-11-29 DIAGNOSIS — O09899 Supervision of other high risk pregnancies, unspecified trimester: Secondary | ICD-10-CM

## 2019-11-29 DIAGNOSIS — O99891 Other specified diseases and conditions complicating pregnancy: Secondary | ICD-10-CM

## 2019-11-29 DIAGNOSIS — O09293 Supervision of pregnancy with other poor reproductive or obstetric history, third trimester: Secondary | ICD-10-CM

## 2019-11-29 DIAGNOSIS — O0993 Supervision of high risk pregnancy, unspecified, third trimester: Secondary | ICD-10-CM

## 2019-11-29 DIAGNOSIS — O0990 Supervision of high risk pregnancy, unspecified, unspecified trimester: Secondary | ICD-10-CM

## 2019-11-29 NOTE — Patient Instructions (Signed)
Return to office for any scheduled appointments. Call the office or go to the MAU at [facility] [place] if:  You begin to have strong, frequent contractions  Your water breaks.  Sometimes it is a big gush of fluid, sometimes it is just a trickle that keeps getting your panties wet or running down your legs  You have vaginal bleeding.  It is normal to have a small amount of spotting if your cervix was checked.   You do not feel your baby moving like normal.  If you do not, get something to eat and drink and lay down and focus on feeling your baby move.   If your baby is still not moving like normal, you should call the office or go to MAU.  Any other obstetric concerns.   

## 2019-11-29 NOTE — Progress Notes (Signed)
PRENATAL VISIT NOTE  Subjective:  [name] Reyes is a 29 y.o. [phone] at [redacted]w[redacted]d being seen today for ongoing prenatal care.  She is currently monitored for the following issues for this high-risk pregnancy and has Prior pregnancy with fetal demise at 34 weeks, antepartum; Prior pregnancy complicated by SGA (small for gestational age), antepartum; Environmental and seasonal allergies; Supervision of high risk pregnancy, antepartum; and Rubella non-immune status, antepartum on their problem list.  Patient reports no complaints. No VB or contractions. Good FM. Denies leaking of fluid.   The following portions of the patient's history were reviewed and updated as appropriate: allergies, current medications, past family history, past medical history, past social history, past surgical history and problem list.   Objective:  There were no vitals filed for this visit.  Fetal Status:        Presentation: Vertex  General:  Alert, oriented and cooperative. Patient is in no acute distress.  Skin: Skin is warm and dry. No rash noted.   Cardiovascular: Normal heart rate noted  Respiratory: Normal respiratory effort, no problems with respiration noted  Abdomen: Soft, gravid, appropriate for gestational age.        Pelvic: Cervical exam deferred        Extremities: Normal range of motion.     Mental Status: Normal mood and affect. Normal behavior. Normal judgment and thought content.   Imaging: [place] MFM FETAL BPP WO NON STRESS  Result Date: 11/22/2019 ----------------------------------------------------------------------  OBSTETRICS REPORT                       (Signed Final 11/22/2019 01:59 pm) ---------------------------------------------------------------------- Patient Info  ID #:       [id number]                          D.O.B.:  03-30-1991 (29 yrs)  Name:       [name] Reyes               Visit Date: 11/22/2019 10:46 am ----------------------------------------------------------------------  Performed By  Attending:        Noralee Space MD        Ref. Address:     Faculty Practice  Performed By:     Lenise Arena        Location[facility]                                     [facility]  Referred By:      Currie Paris NP ---------------------------------------------------------------------- Orders  #  Description                           Code        Ordered By  1  [place] MFM OB DETAIL +14 WK               76811.01    Nolene Bernheim  2  [place] MFM FETAL BPP WO NON               76819.01  KATHRYN KOOISTRA     STRESS  3  US MFM UA CORD DOPPLER                76820.02    KATHRYN KOOISTRA ----------------------------------------------------------------------  #  Order #                     Accession #                Episode #  1  [id number]                   [id number]                 [id number]  2  [id number]                   [id number]                 [id number]  3  [id number]                   [id number]                 [id number] ---------------------------------------------------------------------- Indications  Insufficient Prenatal Care                     O09.30  Encounter for antenatal screening for          Z36.3  malformations (low risk NIPS)  Size of fetus inconsistent with dates in third O26.843  trimester  Poor obstetric history: Previous neonatal      O09.299  death @ 34 weeks  Poor obstetric history: Previous preterm       O09.219  delivery, antepartum  [redacted] weeks gestation of pregnancy                Z3A.35 ---------------------------------------------------------------------- Vital Signs  Weight (lb): 166                               Height:        5'2"  BMI:         30.36 ---------------------------------------------------------------------- Fetal Evaluation  Num Of Fetuses:         1  Fetal Heart Rate(bpm):  144  Cardiac Activity:       Observed  Presentation:           Cephalic  Placenta:               Posterior  P. Cord Insertion:       Visualized, central  Amniotic Fluid  AFI FV:      Within normal limits  AFI Sum(cm)     %Tile       Largest Pocket(cm)  13.1            44          4.2  RUQ(cm)       RLQ(cm)       LUQ(cm)        LLQ(cm)  2.4           2.8           3.7            4.2 ---------------------------------------------------------------------- Biophysical Evaluation  Amniotic F.V:   Within normal limits       F. Tone:        Observed  F. Movement:    Observed  Score:          8/8  F. Breathing:   Observed ---------------------------------------------------------------------- Biometry  BPD:      85.7  mm     G. Age:  34w 4d         29  %    CI:        75.48   %    70 - 86                                                          FL/HC:      21.2   %    20.1 - 22.3  HC:      312.8  mm     G. Age:  35w 0d         12  %    HC/AC:      1.00        0.93 - 1.11  AC:      312.9  mm     G. Age:  35w 2d         52  %    FL/BPD:     77.2   %    71 - 87  FL:       66.2  mm     G. Age:  34w 1d         14  %    FL/AC:      21.2   %    20 - 24  HUM:      56.5  mm     G. Age:  32w 6d         16  %  LV:        7.7  mm  Est. FW:    2526  gm      5 lb 9 oz     32  % ---------------------------------------------------------------------- OB History  Gravidity:    6         Term:   4        Prem:   1  Living:       4 ---------------------------------------------------------------------- Gestational Age  LMP:           37w 3d        Date:  03/05/19                 EDD:   12/10/19  U/S Today:     34w 5d                                        EDD:   12/29/19  Best:          35w 3d     Det. ByMarcella Dubs         EDD:   12/24/19                                      (08/13/19) ---------------------------------------------------------------------- Anatomy  Cranium:               Appears normal         Aortic Arch:  Appears normal  Cavum:                 Appears normal         Ductal Arch:            Not well visualized  Ventricles:             Appears normal         Diaphragm:              Appears normal  Choroid Plexus:        Appears normal         Stomach:                Appears normal, left                                                                        sided  Cerebellum:            Appears normal         Abdomen:                Appears normal  Posterior Fossa:       Appears normal         Abdominal Wall:         Not well visualized  Nuchal Fold:           Not applicable (>20    Cord Vessels:           Appears normal ([redacted]                         wks GA)                                        vessel cord)  Face:                  Orbits nl; profile not Kidneys:                Appear normal                         well visualized  Lips:                  Appears normal         Bladder:                Appears normal  Thoracic:              Appears normal         Spine:                  Ltd views no  intracranial signs of                                                                        NTD  Heart:                 Not well visualized    Upper Extremities:      Appears normal  RVOT:                  Appears normal         Lower Extremities:      Appears normal  LVOT:                  Appears normal  Other:  Technically difficult due to advanced GA and fetal position. ---------------------------------------------------------------------- Doppler - Fetal Vessels  Umbilical Artery   S/D     %tile                                              ADFV    RDFV   2.56       58                                                 No      No ---------------------------------------------------------------------- Cervix Uterus Adnexa  Cervix  Not visualized (advanced GA >24wks) ---------------------------------------------------------------------- Impression  Ms. Deliah Boston, G6 P4, is here for ultrasound evaluation.  She had mid-trimester fetal anatomy scan at [facility]  Radiology. Her  pregnancy is dated by her LMP date.  Mid-trimester ultrasound was consistent with 21-weeks'  gestation and the EDD based on that ultrasound is 12/24/19.  Patient does not have any chronic medical conditions.  Obstetric history is significant for a preterm delivery at 72  weeks' gestation and neonatal demise. Subsequently, she  had 4 term vaginal deliveries and all of them are in good  health.  She does not have gestational diabetes. On cell-free fetal  DNA screening, the risks of fetal aneuploidies are not  increased.  On ultrasound, the fetal biometry is consistent with  gestational age established by 21-week ultrasound (not with  LMP-established date). Amniotic fluid is normal and good  fetal activity is seen. Fetal anatomical survey appears normal  but limited by advanced gestational age. Antenatal testing is  reassuring. Umbilical artery Doppler, performed because of  uncertain dates and suspected fetal growth restriction, shows  normal forward diastolic flow.  I reassured the patient of the findings. I explained that in the  absence of any medical conditions and with no previous fetal  growth restriction, the gestational age is more-likely in  consistent with that established by her mid-trimester  ultrasound.  I recommend that we assign her EDD at 12/24/19 (35w 3d  today). ---------------------------------------------------------------------- Recommendations  -BPP in 2 weeks.  -Fetal growth and BPP in 3 weeks. ----------------------------------------------------------------------                  Noralee Space, MD  Electronically Signed Final Report   11/22/2019 01:59 pm ----------------------------------------------------------------------  [place] MFM OB DETAIL +14 WK  Result Date: 11/22/2019 ----------------------------------------------------------------------  OBSTETRICS REPORT                       (Signed Final 11/22/2019 01:59 pm) ----------------------------------------------------------------------  Patient Info  ID #:       [id number]                          D.O.B.:  1990-12-06 (29 yrs)  Name:       [name] Reyes               Visit Date: 11/22/2019 10:46 am ---------------------------------------------------------------------- Performed By  Attending:        Tama High MD        Ref. Address:     Faculty Practice  Performed By:     Novella Rob        Location:         [facility]                                     Fetal Care  Referred By:      Virginia Rochester NP ---------------------------------------------------------------------- Orders  #  Description                           Code        Ordered By  1  [place] MFM OB DETAIL +14 [place]               76811.01    TERRI BURLESON  2  [place] MFM FETAL BPP WO NON               76819.01    KATHRYN KOOISTRA     STRESS  3  [place] MFM UA CORD DOPPLER                76820.02    KATHRYN KOOISTRA ----------------------------------------------------------------------  #  Order #                     Accession #                Episode #  1  [id number]                   [id number]                 [id number]  2  [id number]                   [id number]                 [id number]  3  [id number]                   [id number]                 [id number] ---------------------------------------------------------------------- Indications  Insufficient Prenatal Care                     O09.30  Encounter for antenatal screening for          Z36.3  malformations (low risk NIPS)  Size of fetus inconsistent with dates in third O26.843  trimester  Poor obstetric history: Previous neonatal      O09.299  death @ 34 weeks  Poor obstetric history: Previous preterm       O09.219  delivery, antepartum  [redacted] weeks gestation of pregnancy                Z3A.35 ---------------------------------------------------------------------- Vital Signs  Weight (lb): 166                               Height:        5'2"  BMI:         30.36  ---------------------------------------------------------------------- Fetal Evaluation  Num Of Fetuses:         1  Fetal Heart Rate(bpm):  144  Cardiac Activity:       Observed  Presentation:           Cephalic  Placenta:               Posterior  P. Cord Insertion:      Visualized, central  Amniotic Fluid  AFI FV:      Within normal limits  AFI Sum(cm)     %Tile       Largest Pocket(cm)  13.1            44          4.2  RUQ(cm)       RLQ(cm)       LUQ(cm)        LLQ(cm)  2.4           2.8           3.7            4.2 ---------------------------------------------------------------------- Biophysical Evaluation  Amniotic F.V:   Within normal limits       F. Tone:        Observed  F. Movement:    Observed                   Score:          8/8  F. Breathing:   Observed ---------------------------------------------------------------------- Biometry  BPD:      85.7  mm     G. Age:  34w 4d         29  %    CI:        75.48   %    70 - 86                                                          FL/HC:      21.2   %    20.1 - 22.3  HC:      312.8  mm     G. Age:  35w 0d         12  %    HC/AC:      1.00        0.93 - 1.11  AC:      312.9  mm     G. Age:  11w 2d  52  %    FL/BPD:     77.2   %    71 - 87  FL:       66.2  mm     G. Age:  34w 1d         14  %    FL/AC:      21.2   %    20 - 24  HUM:      56.5  mm     G. Age:  32w 6d         16  %  LV:        7.7  mm  Est. FW:    2526  gm      5 lb 9 oz     32  % ---------------------------------------------------------------------- OB History  Gravidity:    6         Term:   4        Prem:   1  Living:       4 ---------------------------------------------------------------------- Gestational Age  LMP:           37w 3d        Date:  03/05/19                 EDD:   12/10/19  U/S Today:     34w 5d                                        EDD:   12/29/19  Best:          35w 3d     Det. By:  Marcella Dubs         EDD:   12/24/19                                       (08/13/19) ---------------------------------------------------------------------- Anatomy  Cranium:               Appears normal         Aortic Arch:            Appears normal  Cavum:                 Appears normal         Ductal Arch:            Not well visualized  Ventricles:            Appears normal         Diaphragm:              Appears normal  Choroid Plexus:        Appears normal         Stomach:                Appears normal, left                                                                        sided  Cerebellum:  Appears normal         Abdomen:                Appears normal  Posterior Fossa:       Appears normal         Abdominal Wall:         Not well visualized  Nuchal Fold:           Not applicable (>20    Cord Vessels:           Appears normal ([redacted]                         wks GA)                                        vessel cord)  Face:                  Orbits nl; profile not Kidneys:                Appear normal                         well visualized  Lips:                  Appears normal         Bladder:                Appears normal  Thoracic:              Appears normal         Spine:                  Ltd views no                                                                        intracranial signs of                                                                        NTD  Heart:                 Not well visualized    Upper Extremities:      Appears normal  RVOT:                  Appears normal         Lower Extremities:      Appears normal  LVOT:                  Appears normal  Other:  Technically difficult due to advanced GA and fetal position. ---------------------------------------------------------------------- Doppler - Fetal Vessels  Umbilical Artery   S/D     %tile  ADFV    RDFV   2.56       58                                                 No      No ----------------------------------------------------------------------  Cervix Uterus Adnexa  Cervix  Not visualized (advanced GA >24wks) ---------------------------------------------------------------------- Impression  Ms. Deliah Boston, G6 P4, is here for ultrasound evaluation.  She had mid-trimester fetal anatomy scan at [facility]  [facility]. Her pregnancy is dated by her LMP date.  Mid-trimester ultrasound was consistent with 21-weeks'  gestation and the EDD based on that ultrasound is 12/24/19.  Patient does not have any chronic medical conditions.  Obstetric history is significant for a preterm delivery at 5  weeks' gestation and neonatal demise. Subsequently, she  had 4 term vaginal deliveries and all of them are in good  health.  She does not have gestational diabetes. On cell-free fetal  DNA screening, the risks of fetal aneuploidies are not  increased.  On ultrasound, the fetal biometry is consistent with  gestational age established by 21-week ultrasound (not with  LMP-established date). Amniotic fluid is normal and good  fetal activity is seen. Fetal anatomical survey appears normal  but limited by advanced gestational age. Antenatal testing is  reassuring. Umbilical artery Doppler, performed because of  uncertain dates and suspected fetal growth restriction, shows  normal forward diastolic flow.  I reassured the patient of the findings. I explained that in the  absence of any medical conditions and with no previous fetal  growth restriction, the gestational age is more-likely in  consistent with that established by her mid-trimester  ultrasound.  I recommend that we assign her EDD at 12/24/19 (35w 3d  today). ---------------------------------------------------------------------- Recommendations  -BPP in 2 weeks.  -Fetal growth and BPP in 3 weeks. ----------------------------------------------------------------------                  Noralee Space, MD Electronically Signed Final Report   11/22/2019 01:59 pm  ----------------------------------------------------------------------  [place] MFM UA CORD DOPPLER  Result Date: 11/22/2019 ----------------------------------------------------------------------  OBSTETRICS REPORT                       (Signed Final 11/22/2019 01:59 pm) ---------------------------------------------------------------------- Patient Info  ID #:       [id number]                          D.O.B.:  [birth year] (29 yrs)  Name:       [name] Reyes               Visit Date: 11/22/2019 10:46 am ---------------------------------------------------------------------- Performed By  Attending:        Noralee Space MD        Ref. Address:     Faculty Practice  Performed By:     Lenise Arena        Location[facility]                                     [facility]etal  Care  Referred By:      Currie Paris NP ---------------------------------------------------------------------- Orders  #  Description                           Code        Ordered By  1  [place] MFM OB DETAIL +14 WK               76811.01    TERRI BURLESON  2  [place] MFM FETAL BPP WO NON               76819.01    KATHRYN KOOISTRA     STRESS  3  [place] MFM UA CORD DOPPLER                76820.02    KATHRYN KOOISTRA ----------------------------------------------------------------------  #  Order #                     Accession #                Episode #  1  [id number]                   [id number]                 [id number]  2  [id number]                   [id number]                 [id number]  3  [id number]                   [id number]                 [id number] ---------------------------------------------------------------------- Indications  Insufficient Prenatal Care                     O09.30  Encounter for antenatal screening for          Z36.3  malformations (low risk NIPS)  Size of fetus inconsistent with dates in third O26.843  trimester  Poor obstetric history: Previous neonatal      O09.299  death @ 34  weeks  Poor obstetric history: Previous preterm       O09.219  delivery, antepartum  [redacted] weeks gestation of pregnancy                Z3A.35 ---------------------------------------------------------------------- Vital Signs  Weight (lb): 166                               Height:        5'2"  BMI:         30.36 ---------------------------------------------------------------------- Fetal Evaluation  Num Of Fetuses:         1  Fetal Heart Rate(bpm):  144  Cardiac Activity:       Observed  Presentation:           Cephalic  Placenta:               Posterior  P. Cord Insertion:      Visualized, central  Amniotic Fluid  AFI FV:      Within normal limits  AFI Sum(cm)     %Tile  Largest Pocket(cm)  13.1            44          4.2  RUQ(cm)       RLQ(cm)       LUQ(cm)        LLQ(cm)  2.4           2.8           3.7            4.2 ---------------------------------------------------------------------- Biophysical Evaluation  Amniotic F.V:   Within normal limits       F. Tone:        Observed  F. Movement:    Observed                   Score:          8/8  F. Breathing:   Observed ---------------------------------------------------------------------- Biometry  BPD:      85.7  mm     G. Age:  34w 4d         29  %    CI:        75.48   %    70 - 86                                                          FL/HC:      21.2   %    20.1 - 22.3  HC:      312.8  mm     G. Age:  35w 0d         12  %    HC/AC:      1.00        0.93 - 1.11  AC:      312.9  mm     G. Age:  35w 2d         52  %    FL/BPD:     77.2   %    71 - 87  FL:       66.2  mm     G. Age:  34w 1d         14  %    FL/AC:      21.2   %    20 - 24  HUM:      56.5  mm     G. Age:  32w 6d         16  %  LV:        7.7  mm  Est. FW:    2526  gm      5 lb 9 oz     32  % ---------------------------------------------------------------------- OB History  Gravidity:    6         Term:   4        Prem:   1  Living:       4  ---------------------------------------------------------------------- Gestational Age  LMP:           37w 3d        Date:  03/05/19                 EDD:   12/10/19  U/S Today:     34w 5d  EDD:   12/29/19  Best:          [place] 3d     Det. By:  Marcella Dubs         EDD:   12/24/19                                      (08/13/19) ---------------------------------------------------------------------- Anatomy  Cranium:               Appears normal         Aortic Arch:            Appears normal  Cavum:                 Appears normal         Ductal Arch:            Not well visualized  Ventricles:            Appears normal         Diaphragm:              Appears normal  Choroid Plexus:        Appears normal         Stomach:                Appears normal, left                                                                        sided  Cerebellum:            Appears normal         Abdomen:                Appears normal  Posterior Fossa:       Appears normal         Abdominal Wall:         Not well visualized  Nuchal Fold:           Not applicable (>20    Cord Vessels:           Appears normal ([redacted]                         wks GA)                                        vessel cord)  Face:                  Orbits nl; profile not Kidneys:                Appear normal                         well visualized  Lips:                  Appears normal         Bladder:                Appears  normal  Thoracic:              Appears normal         Spine:                  Ltd views no                                                                        intracranial signs of                                                                        NTD  Heart:                 Not well visualized    Upper Extremities:      Appears normal  RVOT:                  Appears normal         Lower Extremities:      Appears normal  LVOT:                  Appears normal  Other:  Technically difficult due to  advanced GA and fetal position. ---------------------------------------------------------------------- Doppler - Fetal Vessels  Umbilical Artery   S/D     %tile                                              ADFV    RDFV   2.56       58                                                 No      No ---------------------------------------------------------------------- Cervix Uterus Adnexa  Cervix  Not visualized (advanced GA >24wks) ---------------------------------------------------------------------- Impression  Ms. Deliah Boston, G6 P4, is here for ultrasound evaluation.  She had mid-trimester fetal anatomy scan at [facility]  [facility]. Her pregnancy is dated by her LMP date.  Mid-trimester ultrasound was consistent with 21-weeks'  gestation and the EDD based on that ultrasound is 12/24/19.  Patient does not have any chronic medical conditions.  Obstetric history is significant for a preterm delivery at 48  weeks' gestation and neonatal demise. Subsequently, she  had 4 term vaginal deliveries and all of them are in good  health.  She does not have gestational diabetes. On cell-free fetal  DNA screening, the risks of fetal aneuploidies are not  increased.  On ultrasound, the fetal biometry is consistent with  gestational age established by 21-week ultrasound (not with  LMP-established date). Amniotic fluid is normal and good  fetal activity is seen. Fetal anatomical survey appears normal  but limited by advanced gestational age.  Antenatal testing is  reassuring. Umbilical artery Doppler, performed because of  uncertain dates and suspected fetal growth restriction, shows  normal forward diastolic flow.  I reassured the patient of the findings. I explained that in the  absence of any medical conditions and with no previous fetal  growth restriction, the gestational age is more-likely in  consistent with that established by her mid-trimester  ultrasound.  I recommend that we assign her EDD at 12/24/19 (35w 3d  today).  ---------------------------------------------------------------------- Recommendations  -BPP in 2 weeks.  -Fetal growth and BPP in 3 weeks. ----------------------------------------------------------------------                  Noralee Space, MD Electronically Signed Final Report   11/22/2019 01:59 pm ----------------------------------------------------------------------   Assessment and Plan:  Pregnancy: [id number] at [redacted]w[redacted]d 1. Prior pregnancy with fetal demise and current pregnancy in third trimester 2. Supervision of high risk pregnancy, antepartum Patient was dated erroneously by LMP initially, needs to be by 21 week scan as per biometric measurements.  EDC changed to 12/24/19 by MFM (see note on recent scan). Patient informed. IOL that was scheduled next week will be moved up by two weeks. NST performed today was reviewed and was found to be reactive. Subsequent BPP performed today was also reviewed and was found to be 10/10. AFI was also normal. Continue recommended antenatal testing and prenatal care. Negative cultures last visit. - US FETAL BPP W/NONSTRESS; Future  Preterm labor symptoms and general obstetric precautions including but not limited to vaginal bleeding, contractions, leaking of fluid and fetal movement were reviewed in detail with the patient. Please refer to After Visit Summary for other counseling recommendations.   No follow-ups on file.  Future Appointments  Date Time Provider Department Center  12/01/2019  9:45 AM [facility]-SCREENING [facility] None  12/03/2019  8:55 AM [facility]-LD SCHED ROOM [facility] None    Jaynie Collins, MD

## 2019-11-29 NOTE — Progress Notes (Signed)
   PRENATAL VISIT NOTE  Subjective:  [name] Reyes is a 29 y.o. [phone] at [redacted]w[redacted]d being seen today for ongoing prenatal care.  She is currently monitored for the following issues for this high-risk pregnancy and has Prior pregnancy with fetal demise at 34 weeks, antepartum; Prior pregnancy complicated by SGA (small for gestational age), antepartum; Environmental and seasonal allergies; Supervision of high risk pregnancy, antepartum; and Rubella non-immune status, antepartum on their problem list.  Patient reports no complaints.  Contractions: Irregular. Vag. Bleeding: None.  Movement: Present. Denies leaking of fluid.   The following portions of the patient's history were reviewed and updated as appropriate: allergies, current medications, past family history, past medical history, past social history, past surgical history and problem list.   Objective:   Vitals:   11/29/19 0958  BP: 119/71  Pulse: 97  Weight: 167 lb 1.6 oz (75.8 kg)    Fetal Status: Fetal Heart Rate (bpm): NST   Movement: Present     General:  Alert, oriented and cooperative. Patient is in no acute distress.  Skin: Skin is warm and dry. No rash noted.   Cardiovascular: Normal heart rate noted  Respiratory: Normal respiratory effort, no problems with respiration noted  Abdomen: Soft, gravid, appropriate for gestational age.  Pain/Pressure: Present     Pelvic: Cervical exam deferred        Extremities: Normal range of motion.  Edema: None  Mental Status: Normal mood and affect. Normal behavior. Normal judgment and thought content.   Assessment and Plan:  Pregnancy: [id number] at [redacted]w[redacted]d 1. Supervision of high risk pregnancy, antepartum 2. Prior pregnancy with fetal demise and current pregnancy in third trimester Patient was dated erroneously by LMP initially, needs to be by 21 week scan as per biometric measurements.  EDC changed to 12/24/19 by MFM (see note on recent scan). Patient informed. IOL that was scheduled next  week will be moved up by two weeks. NST performed today was reviewed and was found to be reactive. Subsequent BPP performed today was also reviewed and was found to be 10/10. AFI was also normal. Continue recommended antenatal testing and prenatal care. Negative cultures last visit. - US FETAL BPP W/NONSTRESS; Future  Preterm labor symptoms and general obstetric precautions including but not limited to vaginal bleeding, contractions, leaking of fluid and fetal movement were reviewed in detail with the patient. Please refer to After Visit Summary for other counseling recommendations.   Return in 1 week (on 12/06/2019) for weekly NST/BPP and HOB for next 2 weeks (5/20, 5/27).  Future Appointments  Date Time Provider Department Center  12/01/2019  9:45 AM [facility]SCREENING [facility] None  12/18/2019  6:30 AM [facility] SCHED ROOM [facility] None    Jaynie Collins, MD

## 2019-12-01 ENCOUNTER — Inpatient Hospital Stay (HOSPITAL_COMMUNITY)
Admission: AD | Admit: 2019-12-01 | Discharge: 2019-12-01 | Disposition: A | Discharge status: Home / Self Care | Payer: Self-pay | Attending: Obstetrics & Gynecology | Admitting: Obstetrics & Gynecology

## 2019-12-01 ENCOUNTER — Other Ambulatory Visit (HOSPITAL_COMMUNITY): Payer: Self-pay

## 2019-12-01 ENCOUNTER — Other Ambulatory Visit: Payer: Self-pay

## 2019-12-01 ENCOUNTER — Encounter (HOSPITAL_COMMUNITY): Payer: Self-pay | Admitting: Obstetrics & Gynecology

## 2019-12-01 DIAGNOSIS — Z79899 Other long term (current) drug therapy: Secondary | ICD-10-CM | POA: Insufficient documentation

## 2019-12-01 DIAGNOSIS — Z3A36 36 weeks gestation of pregnancy: Secondary | ICD-10-CM | POA: Insufficient documentation

## 2019-12-01 DIAGNOSIS — Z3689 Encounter for other specified antenatal screening: Secondary | ICD-10-CM | POA: Insufficient documentation

## 2019-12-01 DIAGNOSIS — O368130 Decreased fetal movements, third trimester, not applicable or unspecified: Secondary | ICD-10-CM | POA: Insufficient documentation

## 2019-12-01 NOTE — Discharge Instructions (Signed)
Fetal Movement Counts Patient Name: ________________________________________________ Patient Due Date: ____________________ What is a fetal movement count?  A fetal movement count is the number of times that you feel your baby move during a certain amount of time. This may also be called a fetal kick count. A fetal movement count is recommended for every pregnant woman. You may be asked to start counting fetal movements as early as week 28 of your pregnancy. Pay attention to when your baby is most active. You may notice your baby's sleep and wake cycles. You may also notice things that make your baby move more. You should do a fetal movement count:  When your baby is normally most active.  At the same time each day. A good time to count movements is while you are resting, after having something to eat and drink. How do I count fetal movements? 1. Find a quiet, comfortable area. Sit, or lie down on your side. 2. Write down the date, the start time and stop time, and the number of movements that you felt between those two times. Take this information with you to your health care visits. 3. Write down your start time when you feel the first movement. 4. Count kicks, flutters, swishes, rolls, and jabs. You should feel at least 10 movements. 5. You may stop counting after you have felt 10 movements, or if you have been counting for 2 hours. Write down the stop time. 6. If you do not feel 10 movements in 2 hours, contact your health care provider for further instructions. Your health care provider may want to do additional tests to assess your baby's well-being. Contact a health care provider if:  You feel fewer than 10 movements in 2 hours.  Your baby is not moving like he or she usually does. Date: ____________ Start time: ____________ Stop time: ____________ Movements: ____________ Date: ____________ Start time: ____________ Stop time: ____________ Movements: ____________ Date: ____________  Start time: ____________ Stop time: ____________ Movements: ____________ Date: ____________ Start time: ____________ Stop time: ____________ Movements: ____________ Date: ____________ Start time: ____________ Stop time: ____________ Movements: ____________ Date: ____________ Start time: ____________ Stop time: ____________ Movements: ____________ Date: ____________ Start time: ____________ Stop time: ____________ Movements: ____________ Date: ____________ Start time: ____________ Stop time: ____________ Movements: ____________ Date: ____________ Start time: ____________ Stop time: ____________ Movements: ____________ This information is not intended to replace advice given to you by your health care provider. Make sure you discuss any questions you have with your health care provider. Document Revised: 02/22/2019 Document Reviewed: 02/22/2019 Elsevier Patient Education  2020 [employer]  

## 2019-12-01 NOTE — MAU Provider Note (Signed)
History   [id number]   Chief Complaint  Patient presents with  . Decreased Fetal Movement    HPI [name] Reyes is a 29 y.o. female  [phone] here with report of decreased fetal movement since 2 pm.  Reports feeling the baby move approximately 4 times since this morning.  Denies vaginal bleeding or leaking of fluid. Denies contractions. Per patient's spouse, she hasn't eaten anything today. Patient states she just hasn't been hungry.  Patient's last menstrual period was 03/05/2019.  OB History  Gravida Para Term Preterm AB Living  6 5 4 1  0 4  SAB TAB Ectopic Multiple Live Births  0 0 0 0 5    # Outcome Date GA Lbr Len/2nd Weight Sex Delivery Anes PTL Lv  6 Current           5 Term 08/15/15 [redacted]w[redacted]d 02:44 / 00:12 2955 g M Vag-Spont None  LIV     Birth Comments: WNL  4 Term 08/29/14 [redacted]w[redacted]d  3415 g M Vag-Spont EPI  LIV     Birth Comments: wnl  3 Term 07/19/10 [redacted]w[redacted]d  3289 g F Vag-Spont   LIV     Birth Comments: wnl  2 Term 05/13/09 [redacted]w[redacted]d  2438 g F Vag-Spont EPI  LIV     Birth Comments: wnl  1 Preterm 04/30/05 [redacted]w[redacted]d  1361 g M Vag-Spont   ND     Birth Comments: PTL     Past Medical History:  Diagnosis Date  . Vaginal Pap smear, abnormal    colpo 2010    Family History  Problem Relation Age of Onset  . Hyperlipidemia Mother   . Heart disease Father   . Hypertension Father   . Diabetes Father   . Hyperlipidemia Father   . Vision loss Paternal Grandmother     Social History   Socioeconomic History  . Marital status: Married    Spouse name: 2011  . Number of children: 4  . Years of education: 8  . Highest education level: Not on file  Occupational History  . Occupation: Housewife  Tobacco Use  . Smoking status: Never Smoker  . Smokeless tobacco: Never Used  Substance and Sexual Activity  . Alcohol use: Not Currently    Comment: Socially  . Drug use: No  . Sexual activity: Yes    Partners: Male    Birth control/protection: None    Comment:  pregnant  Other Topics Concern  . Not on file  Social History Narrative   Lives in [place] with husband and 4 children.   Social Determinants of Health   Financial Resource Strain:   . Difficulty of Paying Living Expenses:   Food Insecurity: No Food Insecurity  . Worried About [place] in the Last Year: Never true  . Ran Out of Food in the Last Year: Never true  Transportation Needs: No Transportation Needs  . Lack of Transportation (Medical): No  . Lack of Transportation (Non-Medical): No  Physical Activity:   . Days of Exercise per Week:   . Minutes of Exercise per Session:   Stress:   . Feeling of Stress :   Social Connections:   . Frequency of Communication with Friends and Family:   . Frequency of Social Gatherings with Friends and Family:   . Attends Religious Services:   . Active Member of Clubs or Organizations:   . Attends Programme researcher, broadcasting/film/video Meetings:   Banker Marital Status:     Allergies  Allergen  Reactions  . Latex Itching and Swelling    No current facility-administered medications on file prior to encounter.   Current Outpatient Medications on File Prior to Encounter  Medication Sig Dispense Refill  . cetirizine (ZYRTEC) 10 MG tablet Take 10 mg by mouth daily.    . Prenatal Vit-Fe Fumarate-FA (PRENATAL VITAMINS PO) Take 1 tablet by mouth daily.    . fluticasone (FLONASE) 50 MCG/ACT nasal spray 2 sprays each nostril daily (Patient not taking: Reported on 08/02/2019) 16 g 6     Review of Systems  Constitutional: Negative.   Gastrointestinal: Negative.   Genitourinary: Negative.      Physical Exam   Vitals:   12/01/19 1713  BP: 118/85  Pulse: 99  Resp: 18  Temp: 98.8 F (37.1 C)  TempSrc: Oral  Weight: 75.1 kg  Height: 5\' 2"  (1.575 m)    Physical Exam  Nursing note and vitals reviewed. Constitutional: She appears well-developed and well-nourished. No distress.  Respiratory: Effort normal. No respiratory distress.  GI: Soft.   Skin: Skin is warm and dry. She is not diaphoretic.  Psychiatric: She has a normal mood and affect. Her behavior is normal. Judgment and thought content normal.   NST:  Baseline: 135 bpm, Variability: Good {> 6 bpm), Accelerations: Reactive and Decelerations: Absent  MAU Course  Procedures  MDM Patient had BPP/NST yesterday = 10/10  Pt hasn't eaten today. Given graham crackers & apple juice in MAU.   Reactive fetal tracing in MAU. Fetal movement heard through monitor during evaluation.  Patient reports feeling 4 movement in the last 20 minutes. Reassured by movement & monitoring.   Assessment and Plan  A: 1. Decreased fetal movements in third trimester, single or unspecified fetus   2. NST (non-stress test) reactive   3. [redacted] weeks gestation of pregnancy     P: Discharge home Reviewed fetal kick counts Discussed reasons to return to MAU   Jorje Guild, NP 12/01/2019 5:43 PM

## 2019-12-01 NOTE — MAU Note (Signed)
Decrease in fetal movement.  Denies pain, LOF or bleeding. (anxious - hx of loss)

## 2019-12-03 ENCOUNTER — Ambulatory Visit: Payer: Self-pay

## 2019-12-03 ENCOUNTER — Inpatient Hospital Stay (HOSPITAL_COMMUNITY): Payer: Medicaid Other

## 2019-12-04 ENCOUNTER — Telehealth (HOSPITAL_COMMUNITY): Payer: Self-pay | Admitting: *Deleted

## 2019-12-04 ENCOUNTER — Encounter: Payer: Self-pay | Admitting: Student

## 2019-12-04 NOTE — Telephone Encounter (Signed)
Preadmission screen Interpreter number [phone]

## 2019-12-05 ENCOUNTER — Telehealth (HOSPITAL_COMMUNITY): Payer: Self-pay | Admitting: *Deleted

## 2019-12-05 NOTE — Telephone Encounter (Signed)
Preadmission screen Interpreter number [phone]

## 2019-12-06 ENCOUNTER — Ambulatory Visit (INDEPENDENT_AMBULATORY_CARE_PROVIDER_SITE_OTHER): Payer: Self-pay | Admitting: Obstetrics and Gynecology

## 2019-12-06 ENCOUNTER — Other Ambulatory Visit: Payer: Self-pay

## 2019-12-06 ENCOUNTER — Ambulatory Visit (INDEPENDENT_AMBULATORY_CARE_PROVIDER_SITE_OTHER): Payer: Self-pay

## 2019-12-06 ENCOUNTER — Ambulatory Visit: Payer: Self-pay | Admitting: *Deleted

## 2019-12-06 ENCOUNTER — Telehealth (HOSPITAL_COMMUNITY): Payer: Self-pay | Admitting: *Deleted

## 2019-12-06 VITALS — BP 115/66 | HR 103 | Wt 165.4 lb

## 2019-12-06 DIAGNOSIS — O0990 Supervision of high risk pregnancy, unspecified, unspecified trimester: Secondary | ICD-10-CM

## 2019-12-06 DIAGNOSIS — O09293 Supervision of pregnancy with other poor reproductive or obstetric history, third trimester: Secondary | ICD-10-CM

## 2019-12-06 DIAGNOSIS — Z3A37 37 weeks gestation of pregnancy: Secondary | ICD-10-CM

## 2019-12-06 DIAGNOSIS — O0993 Supervision of high risk pregnancy, unspecified, third trimester: Secondary | ICD-10-CM

## 2019-12-06 NOTE — Progress Notes (Signed)
   PRENATAL VISIT NOTE  Subjective:  [name] Reyes is a 29 y.o. [phone] at [redacted]w[redacted]d being seen today for ongoing prenatal care.  She is currently monitored for the following issues for this high-risk pregnancy and has Prior pregnancy with fetal demise at 34 weeks, antepartum; Prior pregnancy complicated by SGA (small for gestational age), antepartum; Environmental and seasonal allergies; Supervision of high risk pregnancy, antepartum; and Rubella non-immune status, antepartum on their problem list.  Patient reports no complaints.  Contractions: Not present. Vag. Bleeding: None.  Movement: Present. Denies leaking of fluid.   The following portions of the patient's history were reviewed and updated as appropriate: allergies, current medications, past family history, past medical history, past social history, past surgical history and problem list.   Objective:   Vitals:   12/06/19 1339  BP: 115/66  Pulse: (!) 103  Weight: 165 lb 6.4 oz (75 kg)    Fetal Status:   Fundal Height: 37 cm Movement: Present     General:  Alert, oriented and cooperative. Patient is in no acute distress.  Skin: Skin is warm and dry. No rash noted.   Cardiovascular: Normal heart rate noted  Respiratory: Normal respiratory effort, no problems with respiration noted  Abdomen: Soft, gravid, appropriate for gestational age.  Pain/Pressure: Present     Pelvic: Cervical exam deferred        Extremities: Normal range of motion.     Mental Status: Normal mood and affect. Normal behavior. Normal judgment and thought content.   Assessment and Plan:  Pregnancy: [id number] at [redacted]w[redacted]d 1. Supervision of high risk pregnancy, antepartum  + fetal movement  NST today reactive, baseline 130's, 15x15 accel,no decels. Occasional contractions.   2. Prior pregnancy with fetal demise and current pregnancy in third trimester  BPP/NST today Continue antenatal testing Induction scheduled for June 1  Preterm labor symptoms and  general obstetric precautions including but not limited to vaginal bleeding, contractions, leaking of fluid and fetal movement were reviewed in detail with the patient. Please refer to After Visit Summary for other counseling recommendations.   No follow-ups on file.  Future Appointments  Date Time Provider Department Center  12/13/2019  2:15 PM [facility] NST [facility]  12/13/2019  3:15 PM Adam Phenix, MD [facility]  12/15/2019  9:20 AM [facility]-SCREENING [facility] None  12/18/2019  6:30 AM MC-LD SCHED ROOM [facility] None    Venia Carbon, NP

## 2019-12-06 NOTE — Progress Notes (Signed)
Pt had MAU visit on 5/15 due to decreased fetal movement. She reports good fetal movement since then.

## 2019-12-06 NOTE — Telephone Encounter (Signed)
Preadmission screen Interpreter number [phone]

## 2019-12-08 ENCOUNTER — Other Ambulatory Visit: Payer: Self-pay | Admitting: Family Medicine

## 2019-12-12 ENCOUNTER — Telehealth (HOSPITAL_COMMUNITY): Payer: Self-pay | Admitting: *Deleted

## 2019-12-12 NOTE — Telephone Encounter (Signed)
Preadmission screen  

## 2019-12-13 ENCOUNTER — Ambulatory Visit: Payer: Self-pay | Admitting: *Deleted

## 2019-12-13 ENCOUNTER — Ambulatory Visit (INDEPENDENT_AMBULATORY_CARE_PROVIDER_SITE_OTHER): Payer: Self-pay

## 2019-12-13 ENCOUNTER — Ambulatory Visit (INDEPENDENT_AMBULATORY_CARE_PROVIDER_SITE_OTHER): Payer: Self-pay | Admitting: Obstetrics & Gynecology

## 2019-12-13 ENCOUNTER — Other Ambulatory Visit: Payer: Self-pay

## 2019-12-13 VITALS — BP 106/73 | HR 81 | Wt 165.0 lb

## 2019-12-13 DIAGNOSIS — O0993 Supervision of high risk pregnancy, unspecified, third trimester: Secondary | ICD-10-CM

## 2019-12-13 DIAGNOSIS — Z3A38 38 weeks gestation of pregnancy: Secondary | ICD-10-CM

## 2019-12-13 DIAGNOSIS — O09293 Supervision of pregnancy with other poor reproductive or obstetric history, third trimester: Secondary | ICD-10-CM

## 2019-12-13 DIAGNOSIS — O0990 Supervision of high risk pregnancy, unspecified, unspecified trimester: Secondary | ICD-10-CM

## 2019-12-13 NOTE — Progress Notes (Signed)
   PRENATAL VISIT NOTE  Subjective:  [name] Reyes is a 29 y.o. [phone] at [redacted]w[redacted]d being seen today for ongoing prenatal care.  She is currently monitored for the following issues for this high-risk pregnancy and has Prior pregnancy with fetal demise at 34 weeks, antepartum; Prior pregnancy complicated by SGA (small for gestational age), antepartum; Environmental and seasonal allergies; Supervision of high risk pregnancy, antepartum; and Rubella non-immune status, antepartum on their problem list.  Patient reports no complaints.  Contractions: Irregular. Vag. Bleeding: None.  Movement: Present. Denies leaking of fluid.   The following portions of the patient's history were reviewed and updated as appropriate: allergies, current medications, past family history, past medical history, past social history, past surgical history and problem list.   Objective:   Vitals:   12/13/19 1459  BP: 106/73  Pulse: 81  Weight: 74.8 kg    Fetal Status: Fetal Heart Rate (bpm): NST   Movement: Present     General:  Alert, oriented and cooperative. Patient is in no acute distress.  Skin: Skin is warm and dry. No rash noted.   Cardiovascular: Normal heart rate noted  Respiratory: Normal respiratory effort, no problems with respiration noted  Abdomen: Soft, gravid, appropriate for gestational age.  Pain/Pressure: Present     Pelvic: Cervical exam deferred        Extremities: Normal range of motion.     Mental Status: Normal mood and affect. Normal behavior. Normal judgment and thought content.   Assessment and Plan:  Pregnancy: [id number] at [redacted]w[redacted]d 1. Supervision of high risk pregnancy, antepartum NST reactive and BPP 8/8  2. Prior pregnancy with fetal demise and current pregnancy in third trimester IOL 39 weeks is scheduled  Term labor symptoms and general obstetric precautions including but not limited to vaginal bleeding, contractions, leaking of fluid and fetal movement were reviewed in detail  with the patient. Please refer to After Visit Summary for other counseling recommendations.   Return if symptoms worsen or fail to improve, for postpartum.  Future Appointments  Date Time Provider Department Center  12/15/2019  9:20 AM [facility]-SCREENING [facility] None  12/18/2019  6:30 AM MC-LD SCHED ROOM [facility] None    Scheryl Darter, MD

## 2019-12-13 NOTE — Progress Notes (Signed)
IOL scheduled on 6/1.

## 2019-12-13 NOTE — Patient Instructions (Signed)

## 2019-12-14 ENCOUNTER — Telehealth (HOSPITAL_COMMUNITY): Payer: Self-pay | Admitting: *Deleted

## 2019-12-14 NOTE — Telephone Encounter (Signed)
Preadmission screen  

## 2019-12-15 ENCOUNTER — Other Ambulatory Visit (HOSPITAL_COMMUNITY)
Admission: RE | Admit: 2019-12-15 | Discharge: 2019-12-15 | Disposition: A | Discharge status: Home / Self Care | Payer: HRSA Program | Source: Ambulatory Visit | Attending: Family Medicine | Admitting: Family Medicine

## 2019-12-15 DIAGNOSIS — Z01812 Encounter for preprocedural laboratory examination: Secondary | ICD-10-CM | POA: Diagnosis present

## 2019-12-15 DIAGNOSIS — Z20822 Contact with and (suspected) exposure to covid-19: Secondary | ICD-10-CM | POA: Diagnosis not present

## 2019-12-15 LAB — SARS CORONAVIRUS 2 (TAT 6-24 HRS): SARS Coronavirus 2: NEGATIVE

## 2019-12-18 ENCOUNTER — Encounter (HOSPITAL_COMMUNITY): Payer: Self-pay | Admitting: Obstetrics and Gynecology

## 2019-12-18 ENCOUNTER — Inpatient Hospital Stay (HOSPITAL_COMMUNITY): Payer: Medicaid Other

## 2019-12-18 ENCOUNTER — Other Ambulatory Visit: Payer: Self-pay

## 2019-12-18 ENCOUNTER — Inpatient Hospital Stay (HOSPITAL_COMMUNITY)
Admission: AD | Admit: 2019-12-18 | Discharge: 2019-12-19 | DRG: 807 | Disposition: A | Discharge status: Home / Self Care | Payer: Medicaid Other | Attending: Family Medicine | Admitting: Family Medicine

## 2019-12-18 DIAGNOSIS — O26893 Other specified pregnancy related conditions, third trimester: Secondary | ICD-10-CM | POA: Diagnosis present

## 2019-12-18 DIAGNOSIS — Z3A39 39 weeks gestation of pregnancy: Secondary | ICD-10-CM

## 2019-12-18 DIAGNOSIS — O09899 Supervision of other high risk pregnancies, unspecified trimester: Secondary | ICD-10-CM

## 2019-12-18 DIAGNOSIS — O0990 Supervision of high risk pregnancy, unspecified, unspecified trimester: Secondary | ICD-10-CM

## 2019-12-18 LAB — CBC
HCT: 33.5 % — ABNORMAL LOW (ref 36.0–46.0)
Hemoglobin: 10.8 g/dL — ABNORMAL LOW (ref 12.0–15.0)
MCH: 28.4 pg (ref 26.0–34.0)
MCHC: 32.2 g/dL (ref 30.0–36.0)
MCV: 88.2 fL (ref 80.0–100.0)
Platelets: 204 10*3/uL (ref 150–400)
RBC: 3.8 MIL/uL — ABNORMAL LOW (ref 3.87–5.11)
RDW: 14.7 % (ref 11.5–15.5)
WBC: 7.3 10*3/uL (ref 4.0–10.5)
nRBC: 0 % (ref 0.0–0.2)

## 2019-12-18 LAB — TYPE AND SCREEN
ABO/RH(D): A POS
Antibody Screen: NEGATIVE

## 2019-12-18 LAB — ABO/RH: ABO/RH(D): A POS

## 2019-12-18 LAB — RPR: RPR Ser Ql: NONREACTIVE

## 2019-12-18 MED ORDER — DIPHENHYDRAMINE HCL 50 MG/ML IJ SOLN: 12.5000 mg | INTRAMUSCULAR | Status: DC | PRN

## 2019-12-18 MED ORDER — FENTANYL CITRATE (PF) 100 MCG/2ML IJ SOLN: 50.0000 ug | INTRAMUSCULAR | Status: DC | PRN

## 2019-12-18 MED ORDER — MISOPROSTOL 50MCG HALF TABLET: 50.0000 ug | ORAL_TABLET | ORAL | Status: DC | PRN

## 2019-12-18 MED ORDER — DIBUCAINE (PERIANAL) 1 % EX OINT: 1.0000 "application " | TOPICAL_OINTMENT | CUTANEOUS | Status: DC | PRN

## 2019-12-18 MED ORDER — TERBUTALINE SULFATE 1 MG/ML IJ SOLN: 0.2500 mg | Freq: Once | INTRAMUSCULAR | Status: DC | PRN

## 2019-12-18 MED ORDER — SENNOSIDES-DOCUSATE SODIUM 8.6-50 MG PO TABS
2.0000 | ORAL_TABLET | ORAL | Status: DC
  Administered 2019-12-18: 2 via ORAL
  Filled 2019-12-18: qty 2

## 2019-12-18 MED ORDER — SIMETHICONE 80 MG PO CHEW: 80.0000 mg | CHEWABLE_TABLET | ORAL | Status: DC | PRN

## 2019-12-18 MED ORDER — EPHEDRINE 5 MG/ML INJ: 10.0000 mg | INTRAVENOUS | Status: DC | PRN

## 2019-12-18 MED ORDER — PRENATAL MULTIVITAMIN CH: 1.0000 | ORAL_TABLET | Freq: Every day | ORAL | Status: DC

## 2019-12-18 MED ORDER — ACETAMINOPHEN 325 MG PO TABS: 650.0000 mg | ORAL_TABLET | ORAL | Status: DC | PRN

## 2019-12-18 MED ORDER — TETANUS-DIPHTH-ACELL PERTUSSIS 5-2.5-18.5 LF-MCG/0.5 IM SUSP: 0.5000 mL | Freq: Once | INTRAMUSCULAR | Status: DC

## 2019-12-18 MED ORDER — OXYCODONE HCL 5 MG PO TABS
5.0000 mg | ORAL_TABLET | ORAL | Status: DC | PRN
  Administered 2019-12-18: 5 mg via ORAL
  Filled 2019-12-18: qty 1

## 2019-12-18 MED ORDER — PHENYLEPHRINE 40 MCG/ML (10ML) SYRINGE FOR IV PUSH (FOR BLOOD PRESSURE SUPPORT): 80.0000 ug | PREFILLED_SYRINGE | INTRAVENOUS | Status: DC | PRN

## 2019-12-18 MED ORDER — WITCH HAZEL-GLYCERIN EX PADS: 1.0000 "application " | MEDICATED_PAD | CUTANEOUS | Status: DC | PRN

## 2019-12-18 MED ORDER — LACTATED RINGERS IV SOLN: 500.0000 mL | INTRAVENOUS | Status: DC | PRN

## 2019-12-18 MED ORDER — LACTATED RINGERS IV SOLN: INTRAVENOUS | Status: DC

## 2019-12-18 MED ORDER — OXYTOCIN BOLUS FROM INFUSION
500.0000 mL | Freq: Once | INTRAVENOUS | Status: AC
  Administered 2019-12-18: 500 mL via INTRAVENOUS

## 2019-12-18 MED ORDER — ONDANSETRON HCL 4 MG PO TABS: 4.0000 mg | ORAL_TABLET | ORAL | Status: DC | PRN

## 2019-12-18 MED ORDER — ONDANSETRON HCL 4 MG/2ML IJ SOLN: 4.0000 mg | Freq: Four times a day (QID) | INTRAMUSCULAR | Status: DC | PRN

## 2019-12-18 MED ORDER — OXYTOCIN-SODIUM CHLORIDE 30-0.9 UT/500ML-% IV SOLN
1.0000 m[IU]/min | INTRAVENOUS | Status: DC
  Administered 2019-12-18: 2 m[IU]/min via INTRAVENOUS
  Filled 2019-12-18: qty 500

## 2019-12-18 MED ORDER — FENTANYL CITRATE (PF) 100 MCG/2ML IJ SOLN
100.0000 ug | INTRAMUSCULAR | Status: DC | PRN
  Administered 2019-12-18: 100 ug via INTRAVENOUS
  Filled 2019-12-18: qty 2

## 2019-12-18 MED ORDER — ACETAMINOPHEN 325 MG PO TABS
650.0000 mg | ORAL_TABLET | ORAL | Status: DC | PRN
  Administered 2019-12-18 – 2019-12-19 (×4): 650 mg via ORAL
  Filled 2019-12-18 (×4): qty 2

## 2019-12-18 MED ORDER — OXYCODONE HCL 5 MG PO TABS: 10.0000 mg | ORAL_TABLET | ORAL | Status: DC | PRN

## 2019-12-18 MED ORDER — DIPHENHYDRAMINE HCL 25 MG PO CAPS: 25.0000 mg | ORAL_CAPSULE | Freq: Four times a day (QID) | ORAL | Status: DC | PRN

## 2019-12-18 MED ORDER — IBUPROFEN 600 MG PO TABS
600.0000 mg | ORAL_TABLET | Freq: Four times a day (QID) | ORAL | Status: DC
  Administered 2019-12-18 – 2019-12-19 (×4): 600 mg via ORAL
  Filled 2019-12-18 (×4): qty 1

## 2019-12-18 MED ORDER — FENTANYL-BUPIVACAINE-NACL 0.5-0.125-0.9 MG/250ML-% EP SOLN: 12.0000 mL/h | EPIDURAL | Status: DC | PRN

## 2019-12-18 MED ORDER — MEASLES, MUMPS & RUBELLA VAC IJ SOLR: 0.5000 mL | Freq: Once | INTRAMUSCULAR | Status: DC

## 2019-12-18 MED ORDER — LIDOCAINE HCL (PF) 1 % IJ SOLN: 30.0000 mL | INTRAMUSCULAR | Status: DC | PRN

## 2019-12-18 MED ORDER — SOD CITRATE-CITRIC ACID 500-334 MG/5ML PO SOLN: 30.0000 mL | ORAL | Status: DC | PRN

## 2019-12-18 MED ORDER — LACTATED RINGERS IV SOLN: 500.0000 mL | Freq: Once | INTRAVENOUS | Status: DC

## 2019-12-18 MED ORDER — OXYTOCIN-SODIUM CHLORIDE 30-0.9 UT/500ML-% IV SOLN
2.5000 [IU]/h | INTRAVENOUS | Status: DC
  Administered 2019-12-18: 2.5 [IU]/h via INTRAVENOUS
  Filled 2019-12-18: qty 500

## 2019-12-18 MED ORDER — ZOLPIDEM TARTRATE 5 MG PO TABS: 5.0000 mg | ORAL_TABLET | Freq: Every evening | ORAL | Status: DC | PRN

## 2019-12-18 MED ORDER — ONDANSETRON HCL 4 MG/2ML IJ SOLN: 4.0000 mg | INTRAMUSCULAR | Status: DC | PRN

## 2019-12-18 MED ORDER — COCONUT OIL OIL: 1.0000 "application " | TOPICAL_OIL | Status: DC | PRN

## 2019-12-18 MED ORDER — BENZOCAINE-MENTHOL 20-0.5 % EX AERO: 1.0000 "application " | INHALATION_SPRAY | CUTANEOUS | Status: DC | PRN

## 2019-12-18 NOTE — H&P (Signed)
[name] Reyes is a 29 y.o. female presenting for IOL 2/2 prior history of IUFD at 34 weeks.  OB History    Gravida  6   Para  5   Term  4   Preterm  1   AB  0   Living  4     SAB  0   TAB  0   Ectopic  0   Multiple  0   Live Births  5          Past Medical History:  Diagnosis Date  . Vaginal Pap smear, abnormal    colpo 2010   Past Surgical History:  Procedure Laterality Date  . NO PAST SURGERIES     Family History: family history includes Diabetes in her father; Heart disease in her father; Hyperlipidemia in her father and mother; Hypertension in her father; Vision loss in her paternal grandmother. Social History:  reports that she has never smoked. She has never used smokeless tobacco. She reports previous alcohol use. She reports that she does not use drugs.     Maternal Diabetes: No Genetic Screening: Normal Maternal Ultrasounds/Referrals: Normal Fetal Ultrasounds or other Referrals:  None Maternal Substance Abuse:  No Significant Maternal Medications:  None Significant Maternal Lab Results:  Group B Strep negative Other Comments:  None  Nursing Staff Provider  Office Location  [facility] Dating   initially LMP; per MFM, EDD reassiged based on 3rd trimester [place] (see MFM notes from 5/6 appt). New EDD is 12/24/2019.  Language   English Anatomy US  Ordered @ HD- normal on 1/25  Flu Vaccine  08/30/19 Genetic Screen  NIPS: Normal female  AFP:   negative  TDaP vaccine   10/02/19 Hgb A1C or  GTT A1C: 5.0 Third trimester  83/120/73  Rhogam  A positive    LAB RESULTS   Feeding Plan Breast/bottle Blood Type A/Positive/-- (01/14 1224)   Contraception Nexplanon  Antibody Negative (01/14 1224)  Circumcision No-girl Rubella <0.90 (01/14 1224)  Pediatrician  [facility]  RPR Non Reactive (01/14 1224)   Support Person Husband- Avel Sensor HBsAg Negative (01/14 1224)   Prenatal Classes NA HIV Non Reactive (01/14 1224)  BTL Consent NA GBS  Negative    VBAC Consent  Pap 2019- Normal     Hgb Electro  Negative   BP Cuff  Has BP cuff  CF Negative     SMA Negative     Waterbirth  [ ]  Class [ ]  Consent [ ]  CNM visit   Review of Systems  All other systems reviewed and are negative.  Maternal Medical History:  Fetal activity: Perceived fetal activity is normal.   Last perceived fetal movement was within the past hour.    Prenatal complications: no prenatal complications Prenatal Complications - Diabetes: none.    Dilation: 2.5 Effacement (%): 50 Station: -3 Exam by:: H.Price, RN/J.COx, RN Blood pressure 121/78, pulse 77, temperature 98.6 F (37 C), temperature source Oral, resp. rate 18, height 5\' 1"  (1.549 m), weight 75.3 kg, last menstrual period 03/05/2019, SpO2 100 %. Maternal Exam:  Abdomen: Patient reports no abdominal tenderness. Fetal presentation: vertex  Introitus: Normal vulva. Normal vagina.  Pelvis: adequate for delivery.      Fetal Exam Fetal Monitor Review: Mode: ultrasound.   Baseline rate: 130.  Variability: moderate (6-25 bpm).   Pattern: accelerations present and no decelerations.    Fetal State Assessment: Category I - tracings are normal.     Physical Exam  Nursing  note and vitals reviewed. Constitutional: She is oriented to person, place, and time. She appears well-developed and well-nourished. No distress.  HENT:  Head: Normocephalic.  Cardiovascular: Normal rate.  Respiratory: Effort normal.  GI: Soft. There is no abdominal tenderness. There is no rebound.  Genitourinary:    Vulva normal.   Neurological: She is alert and oriented to person, place, and time.  Skin: Skin is warm and dry.  Psychiatric: She has a normal mood and affect.    Prenatal labs: ABO, Rh: A/Positive/-- (01/14 1224) Antibody: Negative (01/14 1224) Rubella: <0.90 (01/14 1224) RPR: Non Reactive (03/16 0859)  HBsAg: Negative (01/14 1224)  HIV: Non Reactive (03/16 0859)  GBS: Negative/-- (05/04 1735)    Assessment/Plan: 29 y.o. [id number] at [redacted]w[redacted]d   Admit to labor and delivery  Oxytocin 2x2 Epidural PRN  Anticipate NSVD   Marcille Buffy DNP, CNM  12/18/19  9:40 AM

## 2019-12-18 NOTE — Discharge Summary (Addendum)
Postpartum Discharge Summary     Patient Name: [name] Reyes DOB: August 09, 1990 MRN: [id number]  Date of admission: 12/18/2019 Delivery date:12/18/2019  Delivering provider: Lenna Sciara  Date of discharge: 12/19/2019  Admitting diagnosis: Labor and delivery, indication for care [O75.9] Intrauterine pregnancy: [redacted]w[redacted]d    Secondary diagnosis:  Active Problems:   Labor and delivery, indication for care  Additional problems: None    Discharge diagnosis: Term Pregnancy Delivered                                              Post partum procedures:None Augmentation: AROM and Pitocin Complications: None  Hospital course: Induction of Labor With Vaginal Delivery   29y.o. yo [phone] [id number] admitted to the hospital 12/18/2019 for induction of labor.  Indication for induction: prior hx of IUFD .  Patient had an uncomplicated labor course as follows: Membrane Rupture Time/Date: 2:23 PM ,12/18/2019   Delivery Method:Vaginal, Spontaneous  Episiotomy: None  Lacerations:  None  Details of delivery can be found in separate delivery note.  Patient had a routine postpartum course. Patient is discharged home 12/19/19.  Newborn Data: Birth date:12/18/2019  Birth time:4:24 PM  Gender:Female  Living status:Living  Apgars:8 ,9  Weight:3270 g   Magnesium Sulfate received: No BMZ received: No Rhophylac:N/A MMR:Yes T-DaP:Given prenatally Flu: N/A Transfusion:No  Physical exam  Vitals:   12/18/19 1910 12/18/19 2300 12/19/19 0303 12/19/19 0637  BP: 125/76 111/65 110/61 106/68  Pulse: 62 66 (!) 58 60  Resp: '17 18 18 18  '$ Temp: 98.3 F (36.8 C) 98.3 F (36.8 C) 98.8 F (37.1 C) 98.2 F (36.8 C)  TempSrc: Oral Oral Oral Oral  SpO2: 100% 99%  99%  Weight:      Height:       General: alert, cooperative and no distress Lochia: appropriate Uterine Fundus: firm Incision: N/A DVT Evaluation: No evidence of DVT seen on physical exam. Negative Homan's sign. Labs: Lab Results  Component  Value Date   WBC 9.0 12/19/2019   HGB 10.7 (L) 12/19/2019   HCT 33.1 (L) 12/19/2019   MCV 89.2 12/19/2019   PLT 221 12/19/2019   CMP Latest Ref Rng & Units 12/31/2016  Glucose 65 - 99 mg/dL 86  BUN 6 - 20 mg/dL 12  Creatinine 0.57 - 1.00 mg/dL 0.64  Sodium 134 - 144 mmol/L 141  Potassium 3.5 - 5.2 mmol/L 4.2  Chloride 96 - 106 mmol/L 105  CO2 20 - 29 mmol/L 21  Calcium 8.7 - 10.2 mg/dL 9.3  Total Protein 6.0 - 8.5 g/dL 7.0  Total Bilirubin 0.0 - 1.2 mg/dL 0.4  Alkaline Phos 39 - 117 IU/L 61  AST 0 - 40 IU/L 8  ALT 0 - 32 IU/L 11   Edinburgh Score: No flowsheet data found.   After visit meds:  Allergies as of 12/19/2019      Reactions   Latex Itching, Swelling      Medication List    TAKE these medications   cetirizine 10 MG tablet Commonly known as: ZYRTEC Take 10 mg by mouth daily.   ibuprofen 600 MG tablet Commonly known as: ADVIL Take 1 tablet (600 mg total) by mouth every 6 (six) hours.   PRENATAL VITAMINS PO Take 1 tablet by mouth daily.   senna-docusate 8.6-50 MG tablet Commonly known as: Senokot-S Take 2  tablets by mouth daily. Start taking on: December 20, 2019        Discharge home in stable condition Infant Feeding: Bottle and Breast Infant Disposition:home with mother Discharge instruction: per After Visit Summary and Postpartum booklet. Activity: Advance as tolerated. Pelvic rest for 6 weeks.  Diet: routine diet Future Appointments: Future Appointments  Date Time Provider [place]  01/25/2020  8:15 AM Rasch, Artist Pais, NP [facility]   Follow up Visit:   Please schedule this patient for a In person postpartum visit in 6 weeks with the following provider: Any provider. Additional Postpartum F/U:Nexplanon insertion at [facility] visit   Low risk pregnancy complicated by: none Delivery mode:  Vaginal, Spontaneous  Anticipated Birth Control:  Nexplanon   12/19/2019 EMILY Madelin Headings, MD   OB FELLOW DISCHARGE ATTESTATION  I have seen and  examined this patient and agree with above documentation in the resident's note.   Phill Myron, D.O. OB Fellow  12/19/2019, 11:21 AM

## 2019-12-18 NOTE — Progress Notes (Signed)
Emalyn Schou Fenton Malling is a 29 y.o. [phone] at [redacted]w[redacted]d by ultrasound admitted for induction of labor due to prior IUFD at 34 wks.  Subjective: Doing well.  Feeling contractions.  Decided she doesn't want epidural.  No other concerns.   Objective: BP 121/79   Pulse 69   Temp 98.6 F (37 C) (Oral)   Resp 18   Ht 5\' 1"  (1.549 m)   Wt 75.3 kg   LMP 03/05/2019   SpO2 100%   BMI 31.38 kg/m  No intake/output data recorded. No intake/output data recorded.  FHT:  FHR: 135 bpm, variability: moderate,  accelerations:  Present,  decelerations:  Absent UC:   regular, every 2 minutes SVE:   Dilation: 4 Effacement (%): 60 Station: -3 Exam by:: Resident/ [email or web address]  Labs: Lab Results  Component Value Date   WBC 7.3 12/18/2019   HGB 10.8 (L) 12/18/2019   HCT 33.5 (L) 12/18/2019   MCV 88.2 12/18/2019   PLT 204 12/18/2019    Assessment / Plan: Induction of labor due to prior IUFD at 34 wks,  progressing well on pitocin  Labor: Progressing on pitocin.  AROM this check.   Fetal Wellbeing:  Category I Pain Control:  Labor support without medications I/D:  GBS- Anticipated MOD:  VD  Keeghan Mcintire 02/17/2020, MD PGY-2 Resident 12/18/2019, 2:27 PM

## 2019-12-19 ENCOUNTER — Ambulatory Visit: Payer: Self-pay

## 2019-12-19 LAB — CBC
HCT: 33.1 % — ABNORMAL LOW (ref 36.0–46.0)
Hemoglobin: 10.7 g/dL — ABNORMAL LOW (ref 12.0–15.0)
MCH: 28.8 pg (ref 26.0–34.0)
MCHC: 32.3 g/dL (ref 30.0–36.0)
MCV: 89.2 fL (ref 80.0–100.0)
Platelets: 221 10*3/uL (ref 150–400)
RBC: 3.71 MIL/uL — ABNORMAL LOW (ref 3.87–5.11)
RDW: 14.7 % (ref 11.5–15.5)
WBC: 9 10*3/uL (ref 4.0–10.5)
nRBC: 0 % (ref 0.0–0.2)

## 2019-12-19 MED ORDER — SENNOSIDES-DOCUSATE SODIUM 8.6-50 MG PO TABS: 2.0000 | ORAL_TABLET | ORAL | 0 refills | Status: DC

## 2019-12-19 MED ORDER — IBUPROFEN 600 MG PO TABS: 600.0000 mg | ORAL_TABLET | Freq: Four times a day (QID) | ORAL | 0 refills | Status: AC

## 2019-12-19 NOTE — Discharge Instructions (Signed)

## 2019-12-19 NOTE — Lactation Note (Signed)
This note was copied from a baby's chart. Lactation Consultation Note Baby is 8 hrs old. Mom stated baby is wanting to BF a lot. Mom stated baby is feeding well, mom is getting sore. Encouraged to call for latch. Discussed positioning, body alignment, breast massage, milk storage, STS, I&O, newborn feeding habits, supply and demand.  Mom has great everted nipples and demonstrated hand expression of colostrum easily. Mom only BF her last child for 6 months. She didn't BF her other children.  Baby is spitting up clear mucous. LC used suction bulb to clear mouth. LC had swaddled baby in crib. Gave mom baby to hold up right for a little while. Mom stated she is scared to go to sleep d/t baby may choke. Encouraged parents to take turn resting and watching baby.  Encouraged mom to call for latch to see if mom is using good technique and body positioning.  Lactation brochure given.  Patient Name: [name] Reyes SWFUX'N Date: 12/19/2019 Reason for consult: Initial assessment;Term   Maternal Data Has patient Reyes taught Hand Expression?: Yes Does the patient have breastfeeding experience prior to this delivery?: Yes  Feeding Feeding Type: Breast Fed  LATCH Score       Type of Nipple: Everted at rest and after stimulation  Comfort (Breast/Nipple): Soft / non-tender  Hold (Positioning): No assistance needed to correctly position infant at breast.     Interventions Interventions: Breast feeding basics reviewed;Support pillows;Position options;Skin to skin;Breast massage;Hand express;Breast compression  Lactation Tools Discussed/Used WIC Program: No   Consult Status Consult Status: Follow-up Date: 12/20/19 Follow-up type: In-patient    Charyl Dancer 12/19/2019, 12:46 AM

## 2019-12-19 NOTE — Lactation Note (Signed)
This note was copied from a baby's chart. Lactation Consultation Note  Patient Name: [name] Reyes Today's Date: 12/19/2019  Mom reports baby [name] [name] Reyes  is very sleepy now.Baby [name] now 15 hours old.   RN asked me to see since they are going home.  Slight bili increase so urged mom to hand express and spoon feed past breastfeedings.  Demo with mom how to do it. Mom reports she has not been feeding well at times.  Infant with minimal weight loss and adequate voids and stools.Urged mom to call lactation for feeding observation before d/c.   Maternal Data    Feeding Feeding Type: Breast Fed  [facility] Score                   Interventions    Lactation Tools Discussed/Used     Consult Status      [name] Reyes 12/19/2019, 8:39 PM

## 2020-01-25 ENCOUNTER — Encounter (INDEPENDENT_AMBULATORY_CARE_PROVIDER_SITE_OTHER): Payer: Self-pay | Admitting: Obstetrics and Gynecology

## 2020-01-25 NOTE — Progress Notes (Signed)
Patient did not keep her appointment today.   Duane Lope, NP 01/25/2020 9:49 AM

## 2021-02-02 ENCOUNTER — Telehealth: Payer: Self-pay

## 2021-02-02 NOTE — Telephone Encounter (Signed)
Pt. Called to report localized left chest pain. Pain is specific to a particular area. Also having headaches, dizzy, and acid reflux. Has happened since February, pt thought contraceptives was the cause so she stopped taking them, but pain has continued. Issue has progressively gotten worse. Not taking any medication. Would like recommendations or appointment

## 2021-02-04 NOTE — Telephone Encounter (Signed)
Appt in next 2 weeks.

## 2021-02-09 NOTE — Telephone Encounter (Signed)
Appt scheduled for 02/12/21

## 2021-02-12 ENCOUNTER — Other Ambulatory Visit: Payer: Self-pay

## 2021-02-12 ENCOUNTER — Ambulatory Visit: Payer: Self-pay | Admitting: Internal Medicine

## 2021-02-12 ENCOUNTER — Encounter: Payer: Self-pay | Admitting: Internal Medicine

## 2021-02-12 VITALS — BP 112/76 | HR 64 | Resp 16 | Ht 61.0 in | Wt 156.0 lb

## 2021-02-12 DIAGNOSIS — R0789 Other chest pain: Secondary | ICD-10-CM

## 2021-02-12 DIAGNOSIS — F410 Panic disorder [episodic paroxysmal anxiety] without agoraphobia: Secondary | ICD-10-CM

## 2021-02-12 MED ORDER — SERTRALINE HCL 50 MG PO TABS
ORAL_TABLET | ORAL | 11 refills | Status: DC
Start: 2021-02-12 — End: 2023-11-10

## 2021-02-12 NOTE — Progress Notes (Signed)
 "   Subjective:    Patient ID: [name] Reyes, female   DOB: 02/09/1991, 30 y.o.   MRN: [id number]   HPI   Anterior left and sometimes right chest pain:  First episode back in February 2022.  Comes and goes.   Describes pain as punctures or knife like in chest--sharp.  No radiation, though can feel a heaviness or weight at the back of her neck.  Currently occurs 6 x per month and lasts about 5 minutes.   2 weeks ago, had an episode that lasted all day on and off.   Also had an episode in church about 1 month ago where she developed chest pain, sweating and felt she would pass out. Never took medication for the pain.   She did have a baby in June of 2021.  She did nurse her for 5 months and then bottle fed, but drinks from cup now.   She does notice pain when she picks up her daughter, who is now around 20 lbs in weight.   Feels her heart beating fast in more recent episodes.  She does feel shortness of breath as well.  Feels anxious with these.   Maybe has had episodes of anxiety in past, even before she was pregnant.    Does have heartburn 3 times weekly.  Takes Tums, which helps.  No definite food causes symptoms, but is after eating.  No melena or hematochezia.  Not associated with chest pain.    Current Meds  Medication Sig   ibuprofen  (ADVIL ) 600 MG tablet Take 1 tablet (600 mg total) by mouth every 6 (six) hours.   Allergies  Allergen Reactions   Latex Itching and Swelling     Review of Systems    Objective:   BP 112/76 (BP Location: Right Arm, Patient Position: Sitting, Cuff Size: Normal)   Pulse 64   Resp 16   Ht 5' 1 (1.549 m)   Wt 156 lb (70.8 kg)   LMP 01/19/2021   Breastfeeding No   BMI 29.48 kg/m   Physical Exam HENT:     Head: Normocephalic and atraumatic.  Eyes:     Extraocular Movements: Extraocular movements intact.     Pupils: Pupils are equal, round, and reactive to light.  Neck:     Thyroid: No thyromegaly.  Cardiovascular:     Rate and  Rhythm: Normal rate and regular rhythm.     Pulses: Normal pulses.     Heart sounds: S1 normal and S2 normal. No murmur heard.    No friction rub. No S3 or S4 sounds.  Pulmonary:     Effort: Pulmonary effort is normal.     Breath sounds: Normal breath sounds.  Chest:     Comments: Minimal tenderness along costosternal margins.  Does not reproduce pain  Abdominal:     General: Bowel sounds are normal.     Palpations: Abdomen is soft. There is no hepatomegaly or splenomegaly.  Musculoskeletal:     Cervical back: Normal range of motion and neck supple.     Right lower leg: No edema.     Left lower leg: No edema.  Lymphadenopathy:     Cervical: No cervical adenopathy.  Neurological:     Mental Status: She is alert.      Assessment & Plan   Possibly mild costochondritis, which she can use ibuprofen  to treat  2.  Panic Attacks:  Sertraline  25 mg daily for 7 days, then increase to 50 mg daily  and follow up in 1 week.  Call if any problems with meds.  Lexicographer for [facility] given.    "

## 2021-02-12 NOTE — Patient Instructions (Signed)
[facility] [place] Address: [address], [place], [place] [address] Hours:  Open ? Closes 8PM Phone: [phone] Appointments: [email or web address]  

## 2021-02-27 ENCOUNTER — Telehealth: Payer: Self-pay | Admitting: Internal Medicine

## 2021-07-04 IMAGING — US US FETAL BPP W/ NON-STRESS
1 series · 13 of 13 positions shown · non-contrast
Comparison: none

[Series 1: us fetal bpp w/ non-stress · 13 acquisitions, 13 frames shown]
[im 1/13]
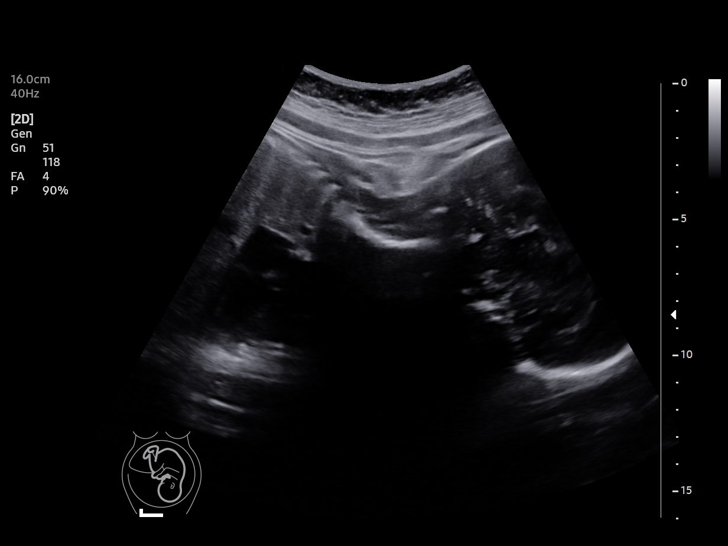
[im 2/13]
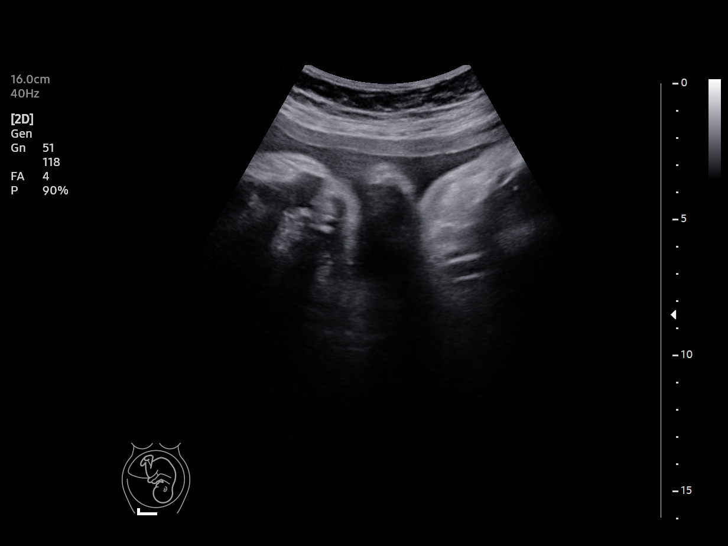
[im 3/13]
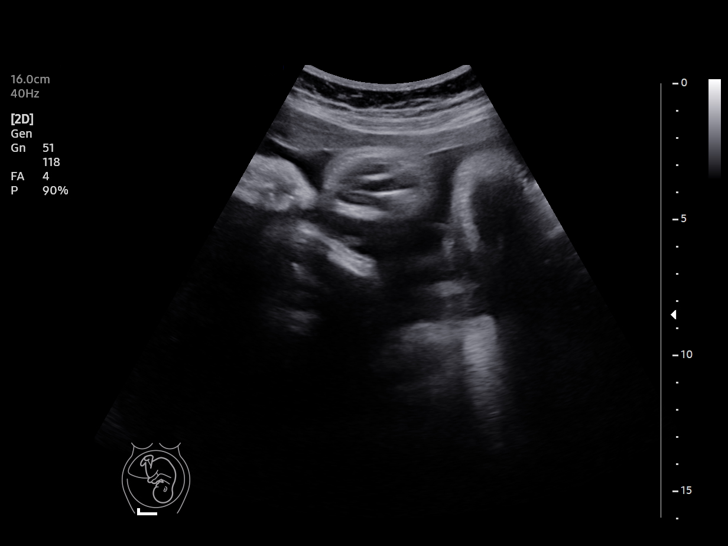
[im 4/13]
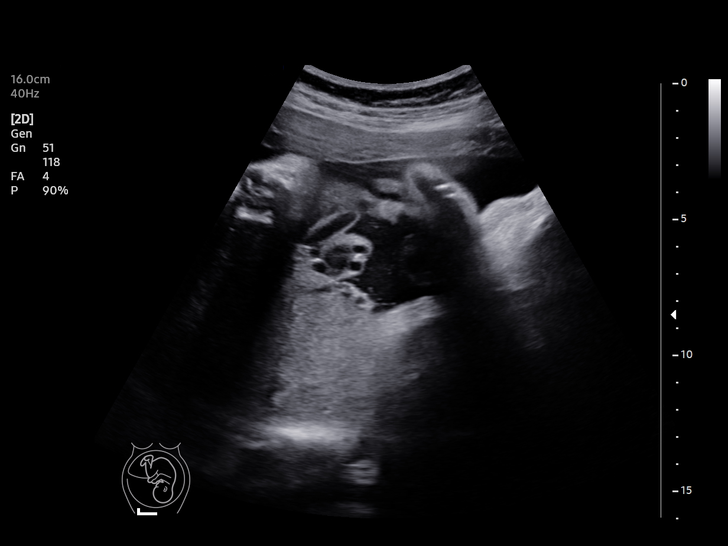
[im 5/13]
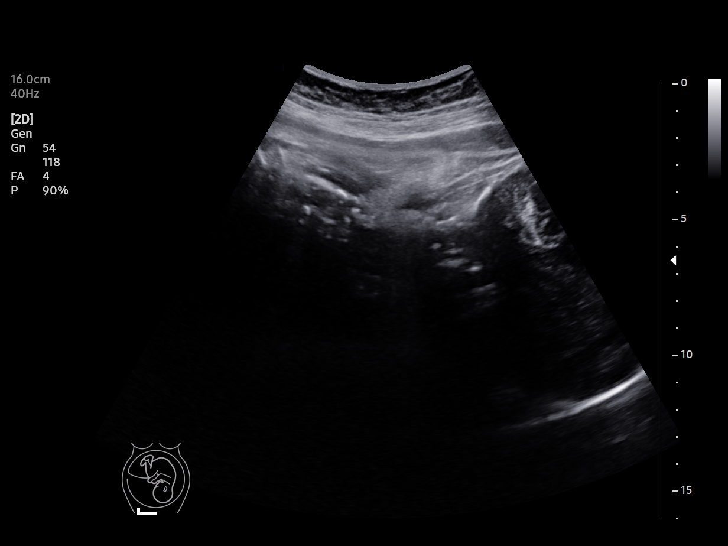
[im 6/13]
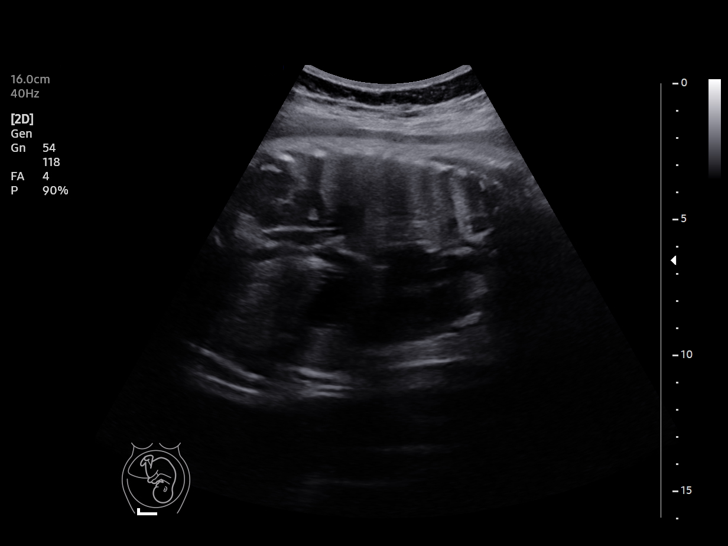
[im 7/13]
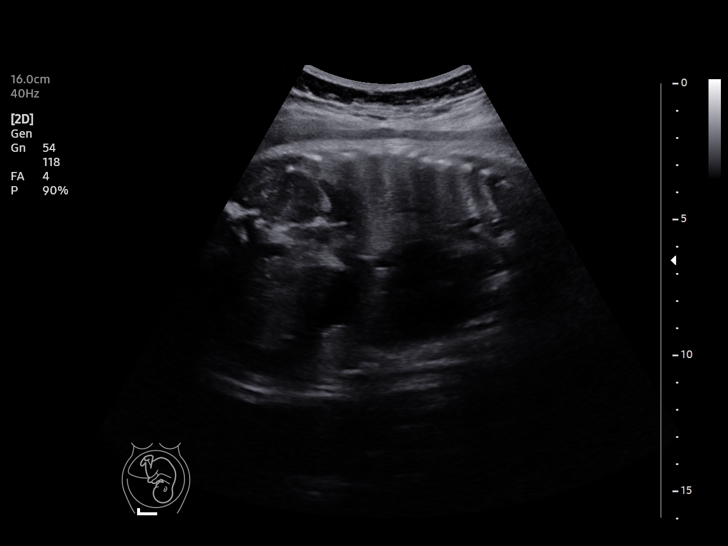
[im 8/13]
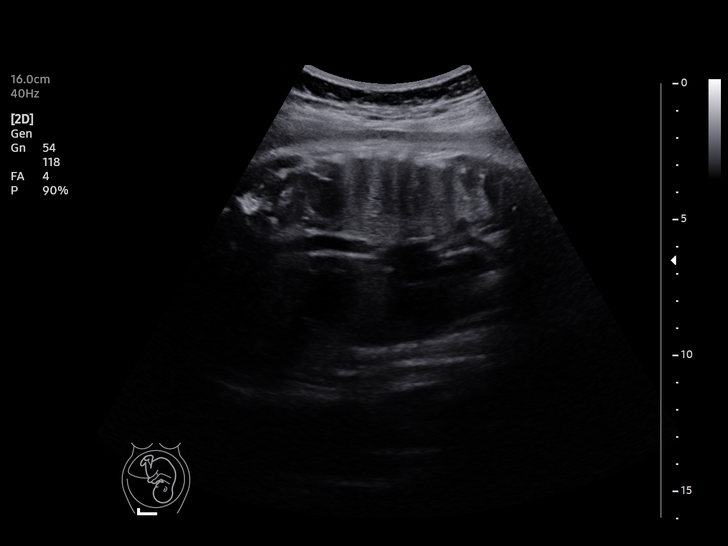
[im 9/13]
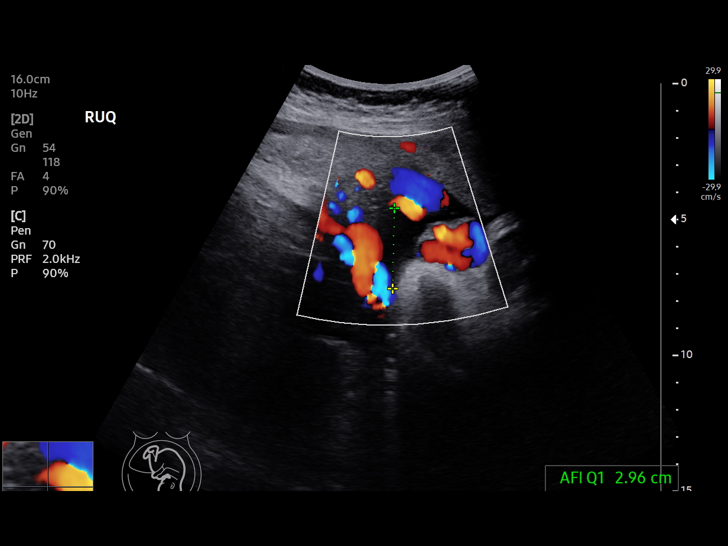
[im 10/13]
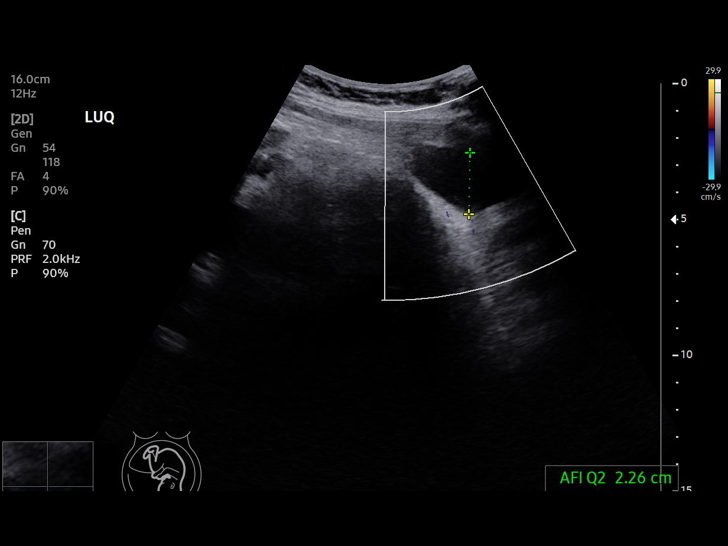
[im 11/13]
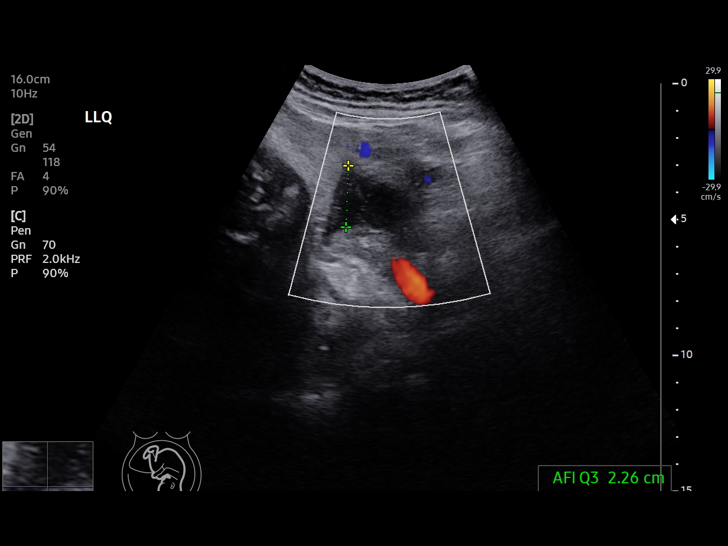
[im 12/13]
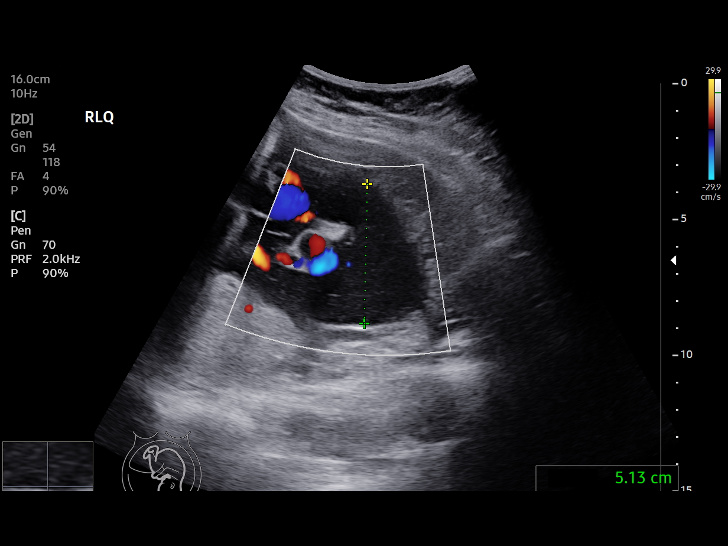
[im 13/13]
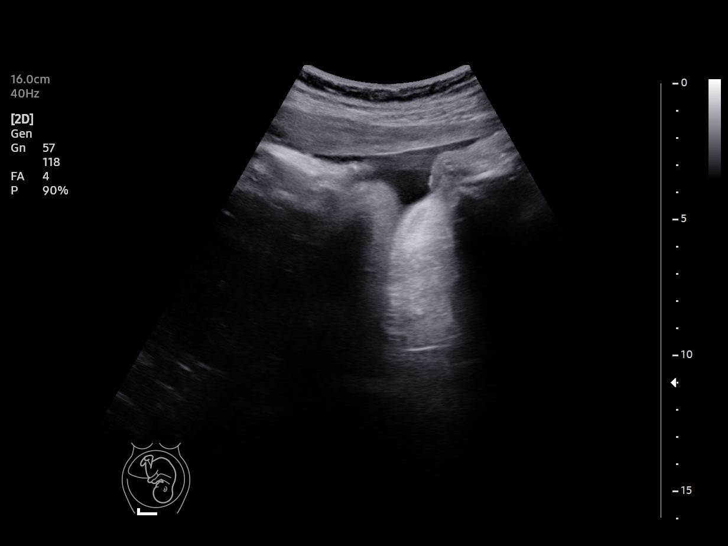

[13 of 13 positions shown; findings below may reference images not displayed]

[REDACTED]care at

 1  US FETAL BPP W/NONSTRESS              76818.4     ABIODUN SHUMWAY

Service(s) Provided

Indications

 37 weeks gestation of pregnancy
 Poor obstetric history: Previous IUFD
 (stillbirth)
Vital Signs

                                                Height:        5'2"
Fetal Evaluation

 Num Of Fetuses:         1
 Preg. Location:         Intrauterine
 Cardiac Activity:       Observed
 Presentation:           Cephalic

 Amniotic Fluid
 AFI FV:      Within normal limits

 AFI Sum(cm)     %Tile       Largest Pocket(cm)
 12.7            45

 RUQ(cm)       RLQ(cm)       LUQ(cm)        LLQ(cm)
 3
Biophysical Evaluation

 Amniotic F.V:   Pocket => 2 cm             F. Tone:        Observed
 F. Movement:    Observed                   N.S.T:          Reactive
 F. Breathing:   Observed                   Score:          [DATE]
OB History

 Gravidity:    6         Term:   4        Prem:   1
 Living:       4
Gestational Age

 LMP:           39w 3d        Date:  03/05/19                 EDD:   12/10/19
 Best:          37w 3d     Det. By:  Early Ultrasound         EDD:   12/24/19
                                     (08/13/19)
Impression

 BPP [DATE]
Recommendations

 -Continue weekly BPP till delivery.
                  Ceejay, Paulus N

## 2021-07-11 IMAGING — US US FETAL BPP W/ NON-STRESS
1 series · 13 of 13 positions shown · non-contrast
Comparison: none

[Series 1: us fetal bpp w/ non-stress · 13 acquisitions, 13 frames shown]
[im 1/13]
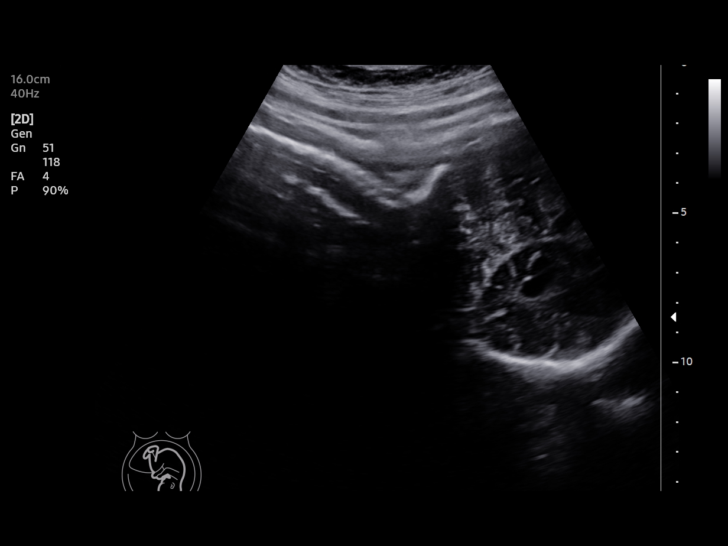
[im 2/13]
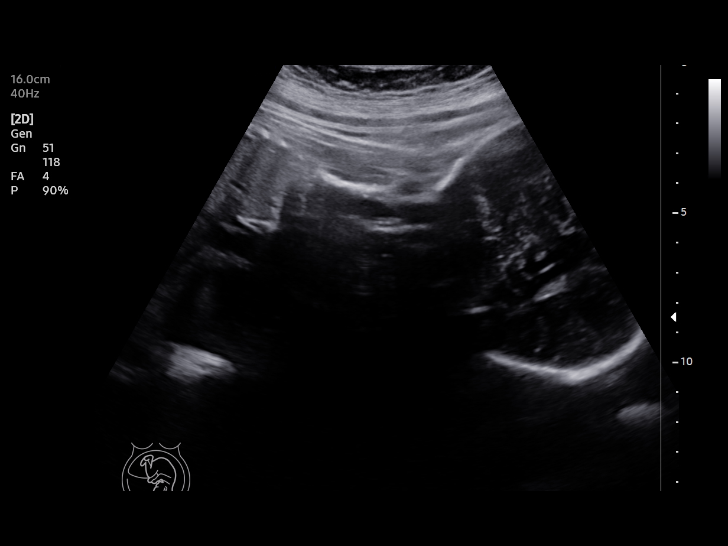
[im 3/13]
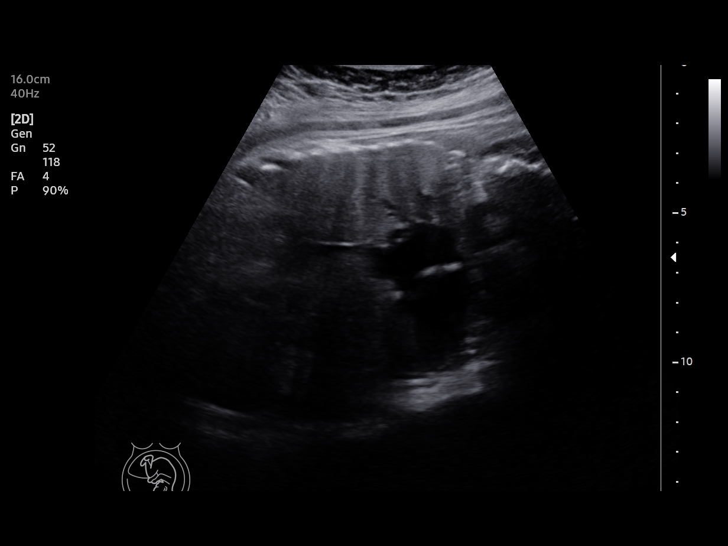
[im 4/13]
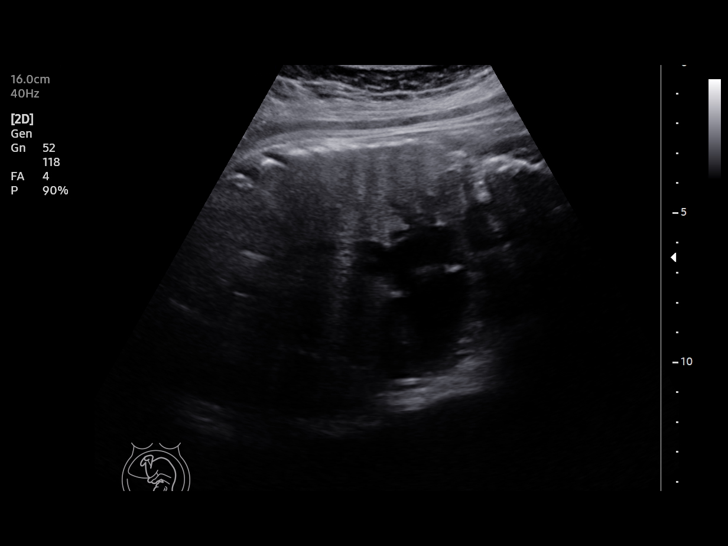
[im 5/13]
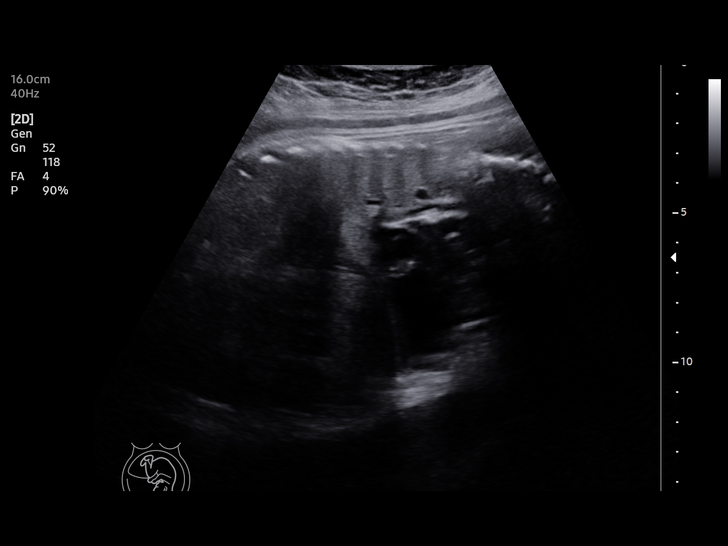
[im 6/13]
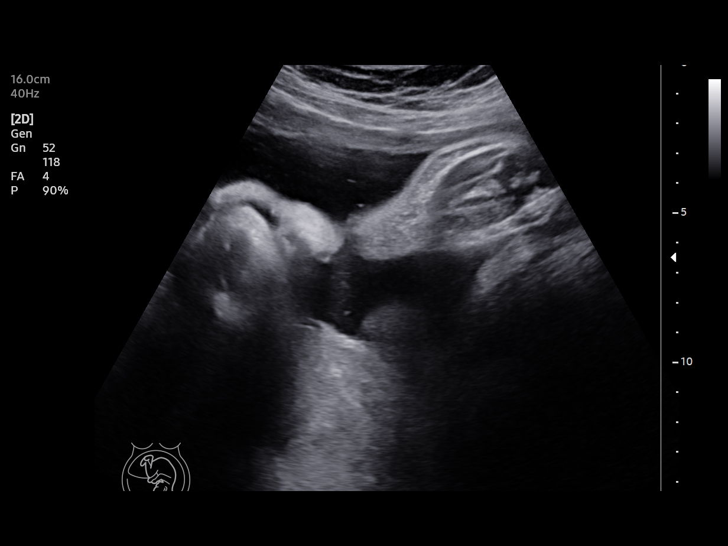
[im 7/13]
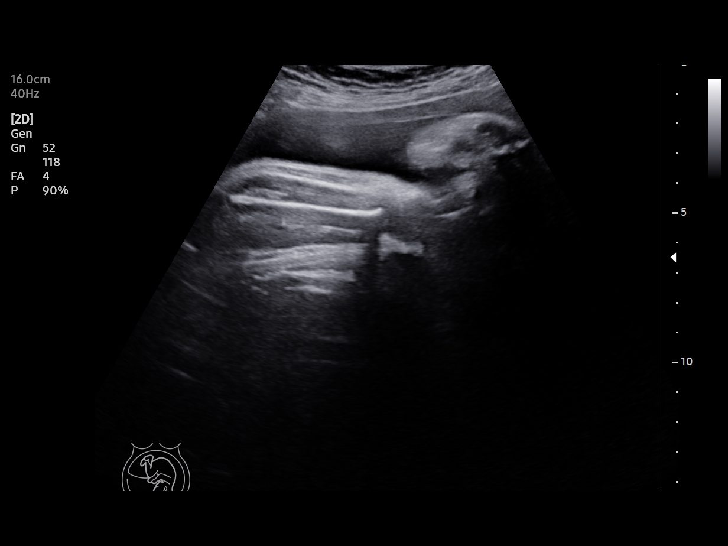
[im 8/13]
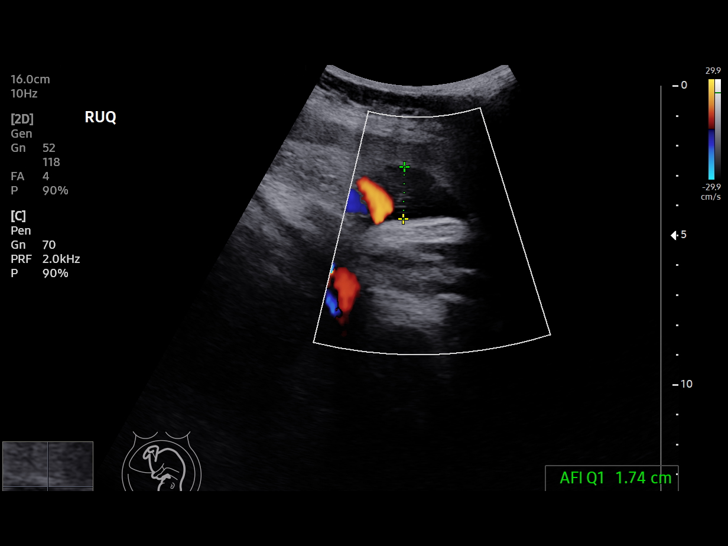
[im 9/13]
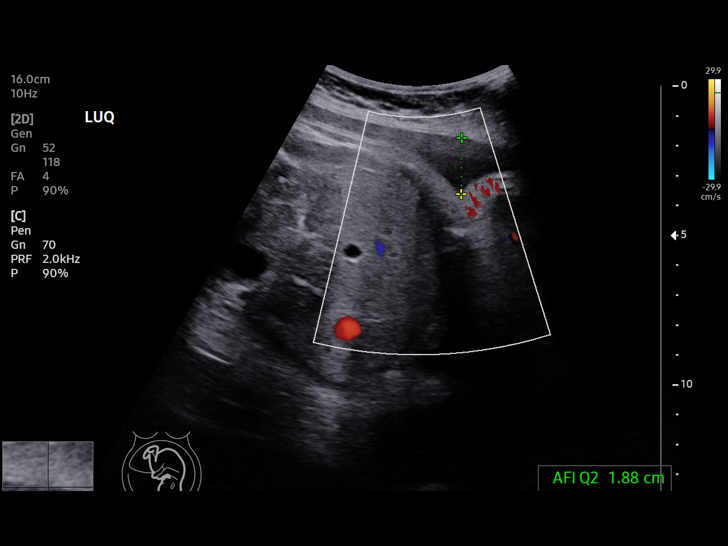
[im 10/13]
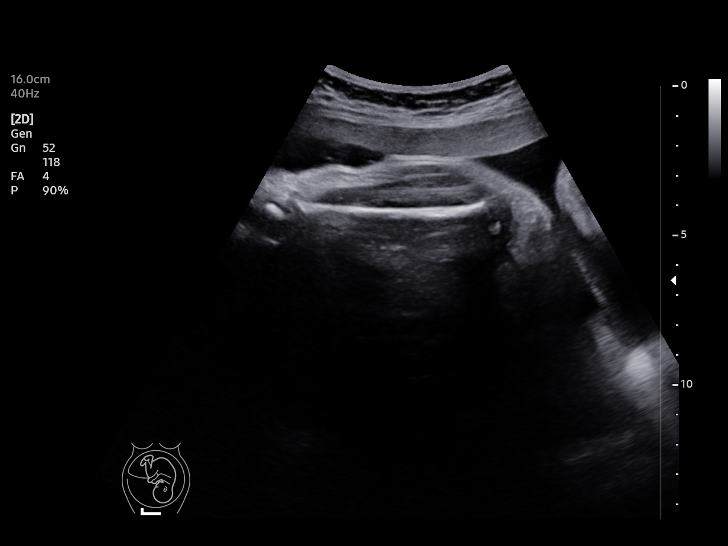
[im 11/13]
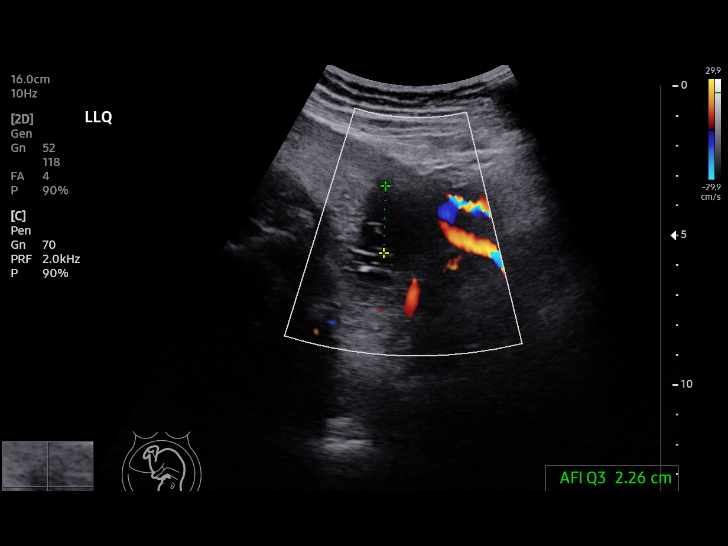
[im 12/13]
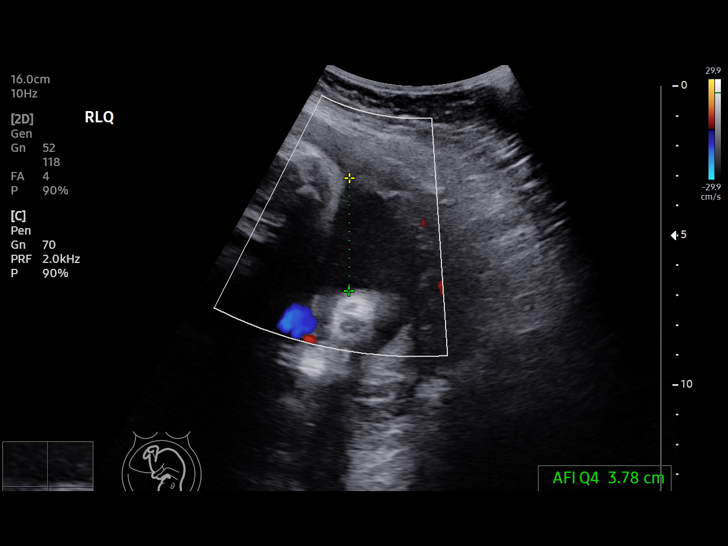
[im 13/13]
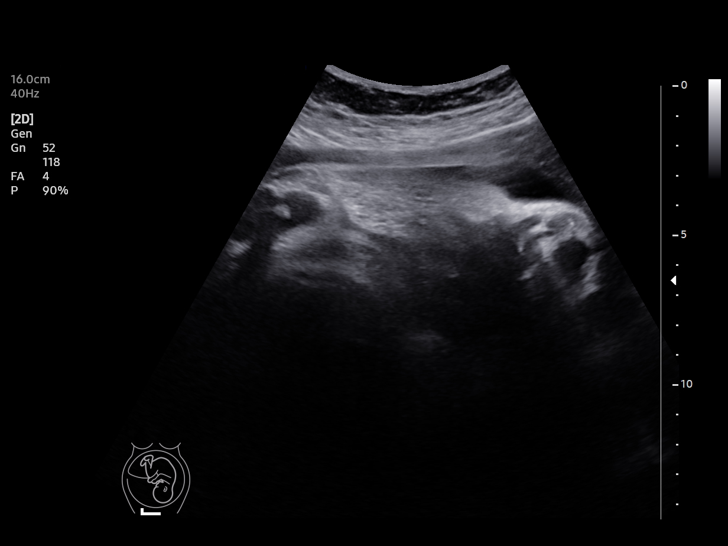

[13 of 13 positions shown; findings below may reference images not displayed]

[REDACTED]care at

 1  US FETAL BPP W/NONSTRESS              76818.4     JAYPE MOKBUL

Service(s) Provided

Indications

 38 weeks gestation of pregnancy
 Poor obstetric history: Previous IUFD
 (stillbirth)
Vital Signs

                                                Height:        5'2"
Fetal Evaluation

 Num Of Fetuses:         1
 Preg. Location:         Intrauterine
 Cardiac Activity:       Observed
 Presentation:           Cephalic

 Amniotic Fluid
 AFI FV:      Within normal limits

 AFI Sum(cm)     %Tile       Largest Pocket(cm)
 9.7             24

 RUQ(cm)       RLQ(cm)       LUQ(cm)        LLQ(cm)

Biophysical Evaluation

 Amniotic F.V:   Pocket => 2 cm             F. Tone:        Observed
 F. Movement:    Observed                   N.S.T:          Reactive
 F. Breathing:   Observed                   Score:          [DATE]
OB History

 Gravidity:    6         Term:   4        Prem:   1
 Living:       4
Gestational Age

 LMP:           40w 3d        Date:  03/05/19                 EDD:   12/10/19
 Best:          38w 3d     Det. By:  Early Ultrasound         EDD:   12/24/19
                                     (08/13/19)
Impression

 NST is reactive. BPP [DATE].
Recommendations

 IOL is scheduled at 39 weeks gestation

## 2023-11-08 ENCOUNTER — Telehealth: Payer: Self-pay

## 2023-11-08 NOTE — Telephone Encounter (Signed)
 Patient would like an appointment for patient is having cold sores, headache and body ache.   We will call patient once we have a cancellation.

## 2023-11-09 NOTE — Telephone Encounter (Signed)
 Patient has been scheduled

## 2023-11-10 ENCOUNTER — Ambulatory Visit: Payer: Self-pay | Admitting: Internal Medicine

## 2023-11-10 ENCOUNTER — Encounter: Payer: Self-pay | Admitting: Internal Medicine

## 2023-11-10 VITALS — BP 138/88 | HR 80 | Resp 16 | Ht 61.0 in | Wt 168.0 lb

## 2023-11-10 DIAGNOSIS — J3089 Other allergic rhinitis: Secondary | ICD-10-CM

## 2023-11-10 DIAGNOSIS — J019 Acute sinusitis, unspecified: Secondary | ICD-10-CM

## 2023-11-10 DIAGNOSIS — B001 Herpesviral vesicular dermatitis: Secondary | ICD-10-CM | POA: Insufficient documentation

## 2023-11-10 MED ORDER — VALACYCLOVIR HCL 500 MG PO TABS: ORAL_TABLET | ORAL | 6 refills | Status: DC

## 2023-11-10 MED ORDER — FLUCONAZOLE 150 MG PO TABS: ORAL_TABLET | ORAL | 0 refills | Status: DC

## 2023-11-10 MED ORDER — AMOXICILLIN 875 MG PO TABS: 875.0000 mg | ORAL_TABLET | Freq: Two times a day (BID) | ORAL | 0 refills | Status: AC

## 2023-11-10 NOTE — Progress Notes (Signed)
 Subjective:    Patient ID: [name] Reyes, female   DOB: 03/21/1991, 33 y.o.   MRN: [id number]   HPI  Tingling on lip 2 weeks ago and then cold sore formed.  About 1 week ago, developed right sided headache and tingling on face and a different cold sore formed under her nose.   Describes spring allergies with pollen. Eyes are watery and sticky in the morning.  Both eyes itchy and feels foreign body in right eye at times.   Is taking Vysine eyedrops for allergies. Sinus congestion was worse over the weekend. + sneezing and green nasal mucous, which is not clearing. Thick posterior pharyngeal drainage with bad taste.   Fever last week--did not take temp.   No dyspnea.   Has been taking Zyrtec since March daily. Also using Flonase , but not since cold sore under nose--was using for 6 weeks prior.   Has not used antiviral for cold sore.   Current Meds  Medication Sig   cetirizine (ZYRTEC) 10 MG tablet Take 10 mg by mouth daily.   ibuprofen  (ADVIL ) 600 MG tablet Take 1 tablet (600 mg total) by mouth every 6 (six) hours.   Allergies  Allergen Reactions   Latex Itching and Swelling     Review of Systems    Objective:   BP 138/88 (BP Location: Left Arm, Patient Position: Sitting, Cuff Size: Normal)   Pulse 80   Resp 16   Ht 5\' 1"  (1.549 m)   Wt 168 lb (76.2 kg)   LMP 10/18/2023 (Approximate)   BMI 31.74 kg/m   Physical Exam HENT:     Head: Normocephalic and atraumatic.     Comments: NT over frontal and maxillary areas    Right Ear: Tympanic membrane, ear canal and external ear normal.     Left Ear: Tympanic membrane, ear canal and external ear normal.     Nose:     Right Turbinates: Swollen.     Left Turbinates: Swollen.      Comments: Large scabbed area with fissuring of scab    Mouth/Throat:     Mouth: Mucous membranes are moist.     Pharynx: Oropharynx is clear.  Eyes:     Extraocular Movements: Extraocular movements intact.     Conjunctiva/sclera:  Conjunctivae normal.     Pupils: Pupils are equal, round, and reactive to light.     Comments: Eyelids slightly swollen and tired  Cardiovascular:     Rate and Rhythm: Normal rate and regular rhythm.     Pulses: Normal pulses.     Heart sounds: Normal heart sounds. No murmur heard.    No friction rub.  Pulmonary:     Effort: Pulmonary effort is normal.     Breath sounds: Normal breath sounds.  Abdominal:     General: Bowel sounds are normal.     Palpations: Abdomen is soft.  Musculoskeletal:     Cervical back: Normal range of motion and neck supple.      Assessment & Plan   Seasonal allergies:  consider switch to Loratadine.  Neti pot discussed for use first thing in morning, followed by Flonase , even if only able to tolerate on left side  2.  Secondary sinusitis:  Amoxicillin  875 mg twice daily for 10 days.  Fluconazole  150 mg one dose if develops yeast infection.  Use OTC vaginal yeast cream if symptoms before finishing antibiotics.    3.  Herpes labialis:  Valtrex  500 mg twice daily for 6  days in future if has tingling or burning or if actual cold sore develops.  No help with this episode.  Vaseline over cold sore to prevent fissuring and pain.

## 2023-11-10 NOTE — Patient Instructions (Addendum)
 Neilmed Neti pot with distilled water at body temp in morning, then Flonase --even if just left nostril until sore heals.

## 2024-01-18 ENCOUNTER — Ambulatory Visit: Payer: Self-pay | Admitting: Internal Medicine

## 2024-03-20 ENCOUNTER — Other Ambulatory Visit: Payer: Self-pay

## 2024-08-08 ENCOUNTER — Telehealth: Payer: Self-pay | Admitting: Internal Medicine

## 2024-08-08 NOTE — Telephone Encounter (Signed)
 Patient would like to be seen for running nose and forehead discomfort for past 3 days. Patient states she also has headache but headache comes and go,   We will call patient when there is a cancellation

## 2024-08-15 NOTE — Telephone Encounter (Signed)
 Patient has been scheduled for 08/16/24.

## 2024-08-16 ENCOUNTER — Ambulatory Visit: Payer: Self-pay | Admitting: Internal Medicine

## 2024-08-16 ENCOUNTER — Encounter: Payer: Self-pay | Admitting: Internal Medicine

## 2024-08-16 VITALS — BP 110/70 | HR 80 | Ht 61.0 in | Wt 161.0 lb

## 2024-08-16 DIAGNOSIS — J0110 Acute frontal sinusitis, unspecified: Secondary | ICD-10-CM

## 2024-08-16 MED ORDER — VALACYCLOVIR HCL 1 G PO TABS: ORAL_TABLET | ORAL | 6 refills | Status: AC

## 2024-08-16 MED ORDER — FLUCONAZOLE 150 MG PO TABS: ORAL_TABLET | ORAL | 0 refills | Status: AC

## 2024-08-16 MED ORDER — AMOXICILLIN 875 MG PO TABS: 875.0000 mg | ORAL_TABLET | Freq: Two times a day (BID) | ORAL | 20 refills | Status: AC

## 2024-08-16 NOTE — Patient Instructions (Signed)
 Neti pot--use distilled water and boil for 10 minutes, then allow to cool to body temp, add saline packet and use in each nostril daily

## 2024-08-16 NOTE — Progress Notes (Signed)
" ° ° °  Subjective:    Patient ID: [name] Reyes, female   DOB: [birth year], 34 y.o.   MRN: [id number]   HPI  Started with eyes hurting about 10 days ago, then forehead with pressure pain, eyes watering, green mucous from nose.  Lot of posterior pharyngeal drainage.  No fever.  Some throat discomfort, but mainly from drainage.   Ears have been hurting as well. No lower respiratory symptoms/no dyspnea.   Has taken ibuprofen , but only helps with forehead discomfort.     Current Meds  Medication Sig   ibuprofen  (ADVIL ) 600 MG tablet Take 1 tablet (600 mg total) by mouth every 6 (six) hours. (Patient taking differently: Take 600 mg by mouth as needed.)   valACYclovir  (VALTREX ) 500 MG tablet 1 tab by mouth twice daily for 3 days for cold sore/burning on nose or lip  Nexplanon  placed in November  Allergies  Allergen Reactions   Latex Itching and Swelling     Review of Systems    Objective:   BP 110/70 (BP Location: Left Arm, Patient Position: Sitting, Cuff Size: Normal)   Pulse 80   Ht 5' 1 (1.549 m)   Wt 161 lb (73 kg)   BMI 30.42 kg/m   Physical Exam Constitutional:      Appearance: Normal appearance.  HENT:     Head: Normocephalic and atraumatic.     Comments: Nontender over frontal and maxillary sinus areas    Right Ear: Tympanic membrane, ear canal and external ear normal.     Left Ear: Tympanic membrane, ear canal and external ear normal.     Nose: Congestion (yellow and crusting overlying swollen turbinates) and rhinorrhea present.     Mouth/Throat:     Mouth: Mucous membranes are moist.     Pharynx: Oropharynx is clear. No oropharyngeal exudate or posterior oropharyngeal erythema.  Eyes:     Extraocular Movements: Extraocular movements intact.     Conjunctiva/sclera: Conjunctivae normal.     Pupils: Pupils are equal, round, and reactive to light.  Neurological:     Mental Status: She is alert.      Assessment & Plan   Acute sinusitis:  amoxicillin  875  mg twice daily for 10 days.  Fluconazole  written for 2 days if develops yeast infection. Neti pot daily  2.  Oral Herpes, recurrent:  Refills for Valtrex  as needed 2 g twice daily for 1 day. "
